# Patient Record
Sex: Female | Born: 1990
Health system: Southern US, Community
[De-identification: ages and names within clinical notes are randomized; demographics above are authoritative.]

## PROBLEM LIST (undated history)

## (undated) DIAGNOSIS — M439 Deforming dorsopathy, unspecified: Secondary | ICD-10-CM

## (undated) DIAGNOSIS — D649 Anemia, unspecified: Secondary | ICD-10-CM

## (undated) DIAGNOSIS — R61 Generalized hyperhidrosis: Secondary | ICD-10-CM

## (undated) DIAGNOSIS — O139 Gestational [pregnancy-induced] hypertension without significant proteinuria, unspecified trimester: Secondary | ICD-10-CM

## (undated) DIAGNOSIS — O24419 Gestational diabetes mellitus in pregnancy, unspecified control: Secondary | ICD-10-CM

## (undated) DIAGNOSIS — M8448XA Pathological fracture, other site, initial encounter for fracture: Secondary | ICD-10-CM

## (undated) HISTORY — DX: Anemia, unspecified: D64.9

## (undated) HISTORY — DX: Gestational (pregnancy-induced) hypertension without significant proteinuria, unspecified trimester: O13.9

## (undated) HISTORY — PX: CHOLECYSTECTOMY: SHX55

## (undated) HISTORY — PX: WISDOM TOOTH EXTRACTION: SHX21

## (undated) HISTORY — DX: Generalized hyperhidrosis: R61

## (undated) HISTORY — DX: Gestational diabetes mellitus in pregnancy, unspecified control: O24.419

---
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
## 2012-04-12 ENCOUNTER — Ambulatory Visit (INDEPENDENT_AMBULATORY_CARE_PROVIDER_SITE_OTHER): Payer: 59 | Admitting: Family Medicine

## 2012-04-12 ENCOUNTER — Encounter: Payer: Self-pay | Admitting: Family Medicine

## 2012-04-12 VITALS — BP 120/72 | HR 99 | Temp 98.5°F | Ht 62.0 in | Wt 204.0 lb

## 2012-04-12 DIAGNOSIS — H6980 Other specified disorders of Eustachian tube, unspecified ear: Secondary | ICD-10-CM

## 2012-04-12 NOTE — Progress Notes (Signed)
   Nature conservation officer at [facility] [address] [place] [address] Phone: [phone] Fax: [phone]  Date:  04/12/2012   Name:  Robin Salas   DOB:  25-Sep-1991   MRN:  [id number]  PCP:  Kerby Nora, MD    Chief Complaint: Otalgia   History of Present Illness:  Robin Salas is a 21 y.o. very pleasant female patient who presents with the following:  Pleasant young patient with some bilateral ear muffling. She has not tried anything at home. She is not really having any significant pain. She is having some decreased hearing somewhat.  Past Medical History, Surgical History, Social History, Family History, Problem List, Medications, and Allergies have been reviewed and updated if relevant.  No current outpatient prescriptions on file prior to visit.    Review of Systems:  Recent uri  Physical Examination: Filed Vitals:   04/12/12 1449  BP: 120/72  Pulse: 99  Temp: 98.5 F (36.9 C)   Filed Vitals:   04/12/12 1449  Height: 5\' 2"  (1.575 m)  Weight: 204 lb (92.534 kg)   Body mass index is 37.31 kg/(m^2). Ideal Body Weight: Weight in (lb) to have BMI = 25: 136.4    GEN: WDWN, NAD, Non-toxic, Alert & Oriented x 3 HEENT: Atraumatic, Normocephalic. Bilateral ears with serous fluid. Anatomy is visualized easily. Ears and Nose: No external deformity. EXTR: No clubbing/cyanosis/edema NEURO: Normal gait.  PSYCH: Normally interactive. Conversant. Not depressed or anxious appearing.  Calm demeanor.    Assessment and Plan: 1. Eustachian tube dysfunction     Advised antihistamine plus Sudafed. If she has not improving in a couple of weeks, something additional, like dosepak could be considered. She will call if pain develops. Not infected now  Hannah Beat, MD

## 2012-05-14 ENCOUNTER — Encounter: Payer: Self-pay | Admitting: Family Medicine

## 2012-05-14 ENCOUNTER — Ambulatory Visit (INDEPENDENT_AMBULATORY_CARE_PROVIDER_SITE_OTHER): Payer: 59 | Admitting: Family Medicine

## 2012-05-14 VITALS — BP 132/70 | Temp 98.5°F | Wt 202.0 lb

## 2012-05-14 DIAGNOSIS — L74519 Primary focal hyperhidrosis, unspecified: Secondary | ICD-10-CM

## 2012-05-14 MED ORDER — GLYCOPYRROLATE 2 MG PO TABS: 2.0000 mg | ORAL_TABLET | Freq: Two times a day (BID) | ORAL | Status: DC

## 2012-05-14 NOTE — Progress Notes (Signed)
  Subjective:    Patient ID: Robin Salas, female    DOB: Feb 06, 1991, 21 y.o.   MRN: [id number]  HPI  21 year old female presents for a refill off robinul. I have not seen her in years, 2009. She has seen several of my partners for acute issues off and on.    She is a homeschooled HS senior who has been on robinul for hyperhidrosis in the last year.  This has been a lifelong problem for the patient.  She has failed to receive much relief from Drysol, other topical scripts and OTC meds.  The robinul has improved the issue significantly. This has had an impact on her socially and had an impact on her choosing to go to homeschool.    She has some SE, dry mouth but tolerable.  Overdue for CPX.        Review of Systems  Constitutional: Negative for fever and fatigue.  HENT: Negative for ear pain.   Eyes: Negative for pain.  Respiratory: Negative for chest tightness and shortness of breath.   Cardiovascular: Negative for chest pain, palpitations and leg swelling.  Gastrointestinal: Negative for abdominal pain.  Genitourinary: Negative for dysuria.       Objective:   Physical Exam  Constitutional: Vital signs are normal. She appears well-developed and well-nourished. She is cooperative.  Non-toxic appearance. She does not appear ill. No distress.  HENT:  Head: Normocephalic.  Right Ear: Hearing, tympanic membrane, external ear and ear canal normal. Tympanic membrane is not erythematous, not retracted and not bulging.  Left Ear: Hearing, tympanic membrane, external ear and ear canal normal. Tympanic membrane is not erythematous, not retracted and not bulging.  Nose: No mucosal edema or rhinorrhea. Right sinus exhibits no maxillary sinus tenderness and no frontal sinus tenderness. Left sinus exhibits no maxillary sinus tenderness and no frontal sinus tenderness.  Mouth/Throat: Uvula is midline, oropharynx is clear and moist and mucous membranes are normal.  Eyes: Conjunctivae, EOM  and lids are normal. Pupils are equal, round, and reactive to light. No foreign bodies found.  Neck: Trachea normal and normal range of motion. Neck supple. Carotid bruit is not present. No mass and no thyromegaly present.  Cardiovascular: Normal rate, regular rhythm, S1 normal, S2 normal, normal heart sounds, intact distal pulses and normal pulses.  Exam reveals no gallop and no friction rub.   No murmur heard. Pulmonary/Chest: Effort normal and breath sounds normal. Not tachypneic. No respiratory distress. She has no decreased breath sounds. She has no wheezes. She has no rhonchi. She has no rales.  Abdominal: Soft. Normal appearance and bowel sounds are normal. There is no tenderness.  Neurological: She is alert.  Skin: Skin is warm, dry and intact. No rash noted.  Psychiatric: Her speech is normal and behavior is normal. Judgment and thought content normal. Her mood appears not anxious. Cognition and memory are normal. She does not exhibit a depressed mood.          Assessment & Plan:

## 2012-05-14 NOTE — Assessment & Plan Note (Signed)
Well controlled. Continue current medication.  

## 2012-08-05 ENCOUNTER — Ambulatory Visit: Payer: 59 | Admitting: Cardiovascular Disease

## 2012-08-14 ENCOUNTER — Encounter (HOSPITAL_COMMUNITY): Payer: Self-pay | Admitting: *Deleted

## 2012-08-14 ENCOUNTER — Emergency Department (HOSPITAL_COMMUNITY)
Admission: EM | Admit: 2012-08-14 | Discharge: 2012-08-15 | Disposition: A | Discharge status: Home / Self Care | Payer: 59 | Attending: Emergency Medicine | Admitting: Emergency Medicine

## 2012-08-14 DIAGNOSIS — M62830 Muscle spasm of back: Secondary | ICD-10-CM

## 2012-08-14 DIAGNOSIS — M5380 Other specified dorsopathies, site unspecified: Secondary | ICD-10-CM | POA: Insufficient documentation

## 2012-08-14 NOTE — ED Notes (Addendum)
Pt states [place] the [place] and hurt [place] the patient was never checked out for the back pain. Pt states that she was playing laser tag all day today and her lower back same area as fall started hurting and was worse tonight. Pt took 4 ibuprofen and heat to relief back pain with no relief. Pt states that she tried to massage the area and then her friend noticed that it was starting to knot up. Pt ambulatory but states pain hurts bad. Pt denies problems with urine or bowel.

## 2012-08-15 ENCOUNTER — Ambulatory Visit: Payer: 59

## 2012-08-15 ENCOUNTER — Ambulatory Visit (INDEPENDENT_AMBULATORY_CARE_PROVIDER_SITE_OTHER): Payer: 59 | Admitting: Internal Medicine

## 2012-08-15 VITALS — BP 132/84 | HR 83 | Temp 98.4°F | Resp 16 | Ht 63.0 in | Wt 203.0 lb

## 2012-08-15 DIAGNOSIS — S20229A Contusion of unspecified back wall of thorax, initial encounter: Secondary | ICD-10-CM

## 2012-08-15 DIAGNOSIS — M549 Dorsalgia, unspecified: Secondary | ICD-10-CM

## 2012-08-15 DIAGNOSIS — S300XXA Contusion of lower back and pelvis, initial encounter: Secondary | ICD-10-CM

## 2012-08-15 DIAGNOSIS — S335XXA Sprain of ligaments of lumbar spine, initial encounter: Secondary | ICD-10-CM

## 2012-08-15 MED ORDER — DIAZEPAM 5 MG PO TABS: 5.0000 mg | ORAL_TABLET | Freq: Four times a day (QID) | ORAL | Status: DC | PRN

## 2012-08-15 MED ORDER — KETOROLAC TROMETHAMINE 30 MG/ML IJ SOLN
30.0000 mg | Freq: Once | INTRAMUSCULAR | Status: AC
  Administered 2012-08-15: 30 mg via INTRAMUSCULAR
  Filled 2012-08-15: qty 1

## 2012-08-15 MED ORDER — HYDROCODONE-ACETAMINOPHEN 5-325 MG PO TABS: 1.0000 | ORAL_TABLET | Freq: Four times a day (QID) | ORAL | Status: DC | PRN

## 2012-08-15 MED ORDER — DIAZEPAM 5 MG PO TABS
5.0000 mg | ORAL_TABLET | Freq: Once | ORAL | Status: AC
  Administered 2012-08-15: 5 mg via ORAL
  Filled 2012-08-15: qty 1

## 2012-08-15 MED ORDER — CYCLOBENZAPRINE HCL 10 MG PO TABS: 10.0000 mg | ORAL_TABLET | Freq: Every day | ORAL | Status: DC

## 2012-08-15 MED ORDER — MELOXICAM 15 MG PO TABS: 15.0000 mg | ORAL_TABLET | Freq: Every day | ORAL | Status: DC

## 2012-08-15 NOTE — ED Provider Notes (Addendum)
History     CSN: [id number]  Arrival date & time 08/14/12  2333   First MD Initiated Contact with Patient 08/15/12 0004      Chief Complaint  Patient presents with  . Back Pain    (Consider location/radiation/quality/duration/timing/severity/associated sxs/prior treatment) HPI Comments: Back pain on and off for the past mont since falling at the beach  The history is provided by the patient.    Past Medical History  Diagnosis Date  . Hyperhydrosis disorder     History reviewed. No pertinent past surgical history.  History reviewed. No pertinent family history.  History  Substance Use Topics  . Smoking status: Never Smoker   . Smokeless tobacco: Not on file  . Alcohol Use: No    OB History    Grav Para Term Preterm Abortions TAB SAB Ect Mult Living                  Review of Systems  Respiratory: Negative for shortness of breath.   Gastrointestinal: Negative for vomiting and constipation.  Genitourinary: Negative for dysuria.  Musculoskeletal: Positive for back pain.  Skin: Negative for rash and wound.    Allergies  Amoxicillin and Penicillins  Home Medications   Current Outpatient Rx  Name  Route  Sig  Dispense  Refill  . GLYCOPYRROLATE 2 MG PO TABS   Oral   Take 1 tablet (2 mg total) by mouth 2 (two) times daily.   180 tablet   3   . DIAZEPAM 5 MG PO TABS   Oral   Take 1 tablet (5 mg total) by mouth every 6 (six) hours as needed for anxiety.   15 tablet   0   . HYDROCODONE-ACETAMINOPHEN 5-325 MG PO TABS   Oral   Take 1 tablet by mouth every 6 (six) hours as needed for pain.   7 tablet   0     BP 136/82  Pulse 90  Temp 97.9 F (36.6 C) (Oral)  Resp 18  SpO2 98%  Physical Exam  Constitutional: She is oriented to person, place, and time. She appears well-developed.  HENT:  Head: Normocephalic.  Eyes: Pupils are equal, round, and reactive to light.  Cardiovascular: Normal rate.   Pulmonary/Chest: Effort normal.  Musculoskeletal:  She exhibits tenderness. She exhibits no edema.       Arms: Neurological: She is alert and oriented to person, place, and time.  Skin: Skin is warm. No erythema.    ED Course  Procedures (including critical care time)  Labs Reviewed - No data to display No results found.   1. Muscle spasm of back       MDM  Muscle spasm         Arman Filter, NP 08/15/12 0102  Arman Filter, NP 08/25/12 [phone]

## 2012-08-15 NOTE — ED Notes (Signed)
Pt c/o lower back pain, reports she hurt her back today while playing laser tag, she slipped and fell. Pt reports she hurt the same spot in her back before.

## 2012-08-15 NOTE — ED Provider Notes (Signed)
Medical screening examination/treatment/procedure(s) were performed by non-physician practitioner and as supervising physician I was immediately available for consultation/collaboration.   Zamar Odwyer L Whitten Andreoni, MD 08/15/12 0707 

## 2012-08-15 NOTE — Progress Notes (Signed)
  Subjective:    Patient ID: Robin Salas, female    DOB: Jul 21, 1991, 21 y.o.   MRN: [id number]  HPIhx abrupt onset low back pain after slipping fall during laser tag yesterday Felt so bad she went to ER last pm but thought she had a very poor evaluationa nd the prescribed meds are having CNS and GI side effects Pain is bilat R>L with rad into R buttock/hurts to get up from lying or sitting/trouble rolling over/pain with fast walk///no numbness or weakness in legs  Past hx of fall onto sacrum 2 months ago that was painful several weeks-never evaluated    Review of Systems No fever No GU sxt No prior MVA     Objective:   Physical Exam In mild to moderate pain Filed Vitals:   08/15/12 1514  BP: 132/84  Pulse: 83  Temp: 98.4 F (36.9 C)  Resp: 16   Wt 203 Tender to palp over entire LS area incl bony landmarks R scia notch tender SLR pos bilat at 75deg Dtrs 2+ symm No perip sens or motor losses      [facility] reading (PRIMARY) by  Dr. Natahsa Marian=NAD/no compr fx or sp process fx//No spondy...   Assessment & Plan:  Lumbar Pain  Contusion lumbar  Sprain/strain lumbar Overweight  Meds ordered this encounter  Medications  . cyclobenzaprine (FLEXERIL) 10 MG tablet    Sig: Take 1 tablet (10 mg total) by mouth at bedtime.    Dispense:  30 tablet    Refill:  0  . meloxicam (MOBIC) 15 MG tablet    Sig: Take 1 tablet (15 mg total) by mouth daily.    Dispense:  30 tablet    Refill:  2  Use vic from ER only if can't sleep Heat Exercises graded Advised 12 weeks exercises followed by long term conditioning w/ wt loss

## 2012-08-15 NOTE — Progress Notes (Signed)
  Subjective:    Patient ID: Robin Salas, female    DOB: 1991-08-28, 21 y.o.   MRN: [id number]  HPI  Patient been having low back pain since yesturday.  Went to the er and was not pleased with what they did.  She was giving valium and an injection.  Prescribed hydrocodone and valium and that did not touch her pain.  Pain came on suddenly.  Played laser tag with some friends all day and then noticed back started hurting.  Room mate noticed a big goose egg on her back.  She took four ibuprofen and iced it.  Ibuprofen did help but then started hurting again.  Pain does not run into leg, when she touches it she feels pain in the buttocks.  No tingling or weakness when walking.  Having difficulty getting in and out of car.  No burning or frequency with urination.  About three months ago, she was skim boarding at the beach and landed on back.  But that pain went away.      Review of Systems     Objective:   Physical Exam        Assessment & Plan:

## 2012-10-20 ENCOUNTER — Encounter (HOSPITAL_COMMUNITY): Payer: Self-pay | Admitting: *Deleted

## 2012-10-20 ENCOUNTER — Emergency Department (HOSPITAL_COMMUNITY)
Admission: EM | Admit: 2012-10-20 | Discharge: 2012-10-20 | Disposition: A | Discharge status: Home / Self Care | Payer: 59 | Source: Home / Self Care | Attending: Emergency Medicine | Admitting: Emergency Medicine

## 2012-10-20 DIAGNOSIS — R6889 Other general symptoms and signs: Secondary | ICD-10-CM

## 2012-10-20 DIAGNOSIS — J111 Influenza due to unidentified influenza virus with other respiratory manifestations: Secondary | ICD-10-CM

## 2012-10-20 MED ORDER — GUAIFENESIN-CODEINE 100-10 MG/5ML PO SYRP
5.0000 mL | ORAL_SOLUTION | Freq: Three times a day (TID) | ORAL | Status: DC | PRN
Start: 2012-10-20 — End: 2012-11-05

## 2012-10-20 MED ORDER — CETIRIZINE-PSEUDOEPHEDRINE ER 5-120 MG PO TB12: 1.0000 | ORAL_TABLET | Freq: Every day | ORAL | Status: DC

## 2012-10-20 NOTE — ED Provider Notes (Signed)
History     CSN: [id number]  Arrival date & time 10/20/12  1233   First MD Initiated Contact with Patient 10/20/12 1243      Chief Complaint  Patient presents with  . URI    (Consider location/radiation/quality/duration/timing/severity/associated sxs/prior treatment) HPI Comments: Patient presents urgent care complaining of a nonproductive cough with tactile fevers with generalized body aches. No shortness of breath. Discomfort swallowing or sore throat and a headache denies any nausea vomiting or diarrheas. 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  Patient is a 22 y.o. female presenting with URI. The history is provided by the patient.  URI The primary symptoms include fever, headaches, sore throat, cough and arthralgias. Primary symptoms do not include fatigue, swollen glands, wheezing, abdominal pain, vomiting, myalgias or rash. The current episode started yesterday. This is a new problem. The problem has not changed since onset. The headache is not associated with eye pain or neck stiffness.  Symptoms associated with the illness include chills, sinus pressure, congestion and rhinorrhea.    Past Medical History  Diagnosis Date  . Hyperhydrosis disorder   . Anemia     History reviewed. No pertinent past surgical history.  Family History  Problem Relation Age of Onset  . Family history unknown: Yes    History  Substance Use Topics  . Smoking status: Never Smoker   . Smokeless tobacco: Not on file  . Alcohol Use: No    OB History    Grav Para Term Preterm Abortions TAB SAB Ect Mult Living                  Review of Systems  Constitutional: Positive for fever, chills and appetite change. Negative for fatigue.  HENT: Positive for congestion, sore throat, rhinorrhea and sinus pressure. Negative for neck pain and neck stiffness.   Eyes: Negative for pain.  Respiratory: Positive for cough. Negative for wheezing.     Cardiovascular: Negative for chest pain and palpitations.  Gastrointestinal: Negative for vomiting, abdominal pain and diarrhea.  Musculoskeletal: Positive for arthralgias. Negative for myalgias.  Skin: Negative for rash.  Neurological: Positive for headaches. Negative for dizziness.    Allergies  Amoxicillin and Penicillins  Home Medications   Current Outpatient Rx  Name  Route  Sig  Dispense  Refill  . CETIRIZINE-PSEUDOEPHEDRINE ER 5-120 MG PO TB12   Oral   Take 1 tablet by mouth daily.   30 tablet   0   . CYCLOBENZAPRINE HCL 10 MG PO TABS   Oral   Take 1 tablet (10 mg total) by mouth at bedtime.   30 tablet   0   . DIAZEPAM 5 MG PO TABS   Oral   Take 1 tablet (5 mg total) by mouth every 6 (six) hours as needed for anxiety.   15 tablet   0   . GLYCOPYRROLATE 2 MG PO TABS   Oral   Take 1 tablet (2 mg total) by mouth 2 (two) times daily.   180 tablet   3   . GUAIFENESIN-CODEINE 100-10 MG/5ML PO SYRP   Oral   Take 5 mLs by mouth 3 (three) times daily as needed for cough or congestion.   120 mL   0   . HYDROCODONE-ACETAMINOPHEN 5-325 MG PO TABS   Oral   Take 1 tablet by mouth every 6 (six) hours as needed for pain.   7 tablet   0   . IBUPROFEN 200 MG PO TABS  Oral   Take 200 mg by mouth every 6 (six) hours as needed.         . MELOXICAM 15 MG PO TABS   Oral   Take 1 tablet (15 mg total) by mouth daily.   30 tablet   2     BP 122/73  Pulse 112  Temp 99.6 F (37.6 C) (Oral)  Resp 16  Ht 5\' 2"  (1.575 m)  Wt 180 lb (81.647 kg)  BMI 32.92 kg/m2  SpO2 100%  LMP 07/25/2012  Physical Exam  Nursing note and vitals reviewed. Constitutional: Vital signs are normal. She appears well-developed and well-nourished.  Non-toxic appearance. She does not have a sickly appearance. She does not appear ill. No distress.  HENT:  Head: Normocephalic.  Right Ear: Tympanic membrane normal.  Left Ear: Tympanic membrane normal.  Nose: Rhinorrhea present.   Mouth/Throat: Uvula is midline. Posterior oropharyngeal erythema present. No oropharyngeal exudate, posterior oropharyngeal edema or tonsillar abscesses.  Eyes: Conjunctivae normal are normal. No scleral icterus.  Neck: Neck supple. No JVD present.  Cardiovascular: Normal rate.  Exam reveals no gallop and no friction rub.   No murmur heard. Pulmonary/Chest: Effort normal and breath sounds normal. No respiratory distress. She has no decreased breath sounds. She has no wheezes. She has no rhonchi. She has no rales.  Lymphadenopathy:    She has no cervical adenopathy.  Neurological: She is alert.  Skin: No erythema.    ED Course  Procedures (including critical care time)  Labs Reviewed - No data to display No results found.   1. Influenza-like symptoms       MDM  Influenza-like illness. Patient is no respiratory distress, have been prescribed medicines for symptom management such as a cough syrup and decongestant antihistamine. Have recommended patient to return for recheck as necessary if any worsening symptoms. Patient agrees with treatment plan and follow-up care as necessary        Jimmie Molly, MD 10/20/12 1332

## 2012-10-20 NOTE — ED Notes (Signed)
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

## 2012-11-05 ENCOUNTER — Ambulatory Visit (INDEPENDENT_AMBULATORY_CARE_PROVIDER_SITE_OTHER): Payer: 59 | Admitting: Family Medicine

## 2012-11-05 ENCOUNTER — Encounter: Payer: Self-pay | Admitting: Family Medicine

## 2012-11-05 VITALS — BP 140/78 | HR 96 | Temp 98.6°F | Ht 62.0 in | Wt 213.0 lb

## 2012-11-05 DIAGNOSIS — E282 Polycystic ovarian syndrome: Secondary | ICD-10-CM

## 2012-11-05 DIAGNOSIS — N912 Amenorrhea, unspecified: Secondary | ICD-10-CM

## 2012-11-05 LAB — POCT URINE PREGNANCY: Preg Test, Ur: NEGATIVE

## 2012-11-05 NOTE — Assessment & Plan Note (Signed)
Upreg negative.  Will eval with labs. Concern for PCOS given weight. ? If obesity is cause of ammenorrhea.  Consider OCPs depending on what lab resutls show.

## 2012-11-05 NOTE — Patient Instructions (Addendum)
We will call with lab results   

## 2012-11-05 NOTE — Progress Notes (Signed)
  Subjective:    Patient ID: Robin Salas, female    DOB: 1990/12/28, 22 y.o.   MRN: [id number]  HPI 22 year old female presents with ammenorhea. She reports last menses was 07/25/2012. Prior to that it hd been very regular. Lasting 7 days, every 28 days. Never been on OCPs.  No new hair growth. No deepening of voice.  Mild weight gain in the last year. Wt Readings from Last 3 Encounters:  11/05/12 213 lb (96.616 kg)  10/20/12 180 lb (81.647 kg)  08/15/12 203 lb (92.08 kg) (97.65%*)   * Growth percentiles are based on CDC 2-20 Years data.   Has been under a lot of stress lately. No new meds. She has never been sexually active.  No family history of PCOS.   Review of Systems  Constitutional: Negative for fever and fatigue.  HENT: Negative for ear pain.   Eyes: Negative for pain.  Respiratory: Negative for chest tightness and shortness of breath.   Cardiovascular: Negative for chest pain, palpitations and leg swelling.  Gastrointestinal: Negative for abdominal pain.  Genitourinary: Negative for dysuria.       Objective:   Physical Exam  Constitutional: Vital signs are normal. She appears well-developed and well-nourished. She is cooperative.  Non-toxic appearance. She does not appear ill. No distress.       Obese.  HENT:  Head: Normocephalic.  Right Ear: Hearing, tympanic membrane, external ear and ear canal normal. Tympanic membrane is not erythematous, not retracted and not bulging.  Left Ear: Hearing, tympanic membrane, external ear and ear canal normal. Tympanic membrane is not erythematous, not retracted and not bulging.  Nose: No mucosal edema or rhinorrhea. Right sinus exhibits no maxillary sinus tenderness and no frontal sinus tenderness. Left sinus exhibits no maxillary sinus tenderness and no frontal sinus tenderness.  Mouth/Throat: Uvula is midline, oropharynx is clear and moist and mucous membranes are normal.  Eyes: Conjunctivae normal, EOM and lids are  normal. Pupils are equal, round, and reactive to light. No foreign bodies found.  Neck: Trachea normal and normal range of motion. Neck supple. Carotid bruit is not present. No mass and no thyromegaly present.  Cardiovascular: Normal rate, regular rhythm, S1 normal, S2 normal, normal heart sounds, intact distal pulses and normal pulses.  Exam reveals no gallop and no friction rub.   No murmur heard. Pulmonary/Chest: Effort normal and breath sounds normal. Not tachypneic. No respiratory distress. She has no decreased breath sounds. She has no wheezes. She has no rhonchi. She has no rales.  Abdominal: Soft. Normal appearance and bowel sounds are normal. There is no tenderness.  Neurological: She is alert.  Skin: Skin is warm, dry and intact. No rash noted.  Psychiatric: Her speech is normal and behavior is normal. Judgment and thought content normal. Her mood appears not anxious. Cognition and memory are normal. She does not exhibit a depressed mood.          Assessment & Plan:

## 2012-11-05 NOTE — Addendum Note (Signed)
Addended by: Alvina Chou on: 11/05/2012 04:41 PM   Modules accepted: Orders

## 2012-11-06 LAB — FOLLICLE STIMULATING HORMONE: FSH: 5.3 m[IU]/mL

## 2012-11-08 LAB — TESTOSTERONE, FREE, TOTAL, SHBG
Sex Hormone Binding: 16 nmol/L — ABNORMAL LOW (ref 18–114)
Testosterone: 52 ng/dL (ref 10–70)

## 2012-11-09 NOTE — Addendum Note (Signed)
Addended byKerby Nora E on: 11/09/2012 01:01 PM   Modules accepted: Orders

## 2012-11-19 ENCOUNTER — Other Ambulatory Visit (INDEPENDENT_AMBULATORY_CARE_PROVIDER_SITE_OTHER): Payer: 59

## 2012-11-19 DIAGNOSIS — N912 Amenorrhea, unspecified: Secondary | ICD-10-CM

## 2012-11-19 DIAGNOSIS — E282 Polycystic ovarian syndrome: Secondary | ICD-10-CM

## 2012-11-19 LAB — LIPID PANEL
HDL: 40.4 mg/dL (ref >39.00)
Total CHOL/HDL Ratio: 4
Triglycerides: 116 mg/dL (ref 0.0–149.0)
VLDL: 23.2 mg/dL (ref 0.0–40.0)

## 2012-11-19 LAB — COMPREHENSIVE METABOLIC PANEL
ALT: 16 U/L (ref 0–35)
AST: 20 U/L (ref 0–37)
Alkaline Phosphatase: 68 U/L (ref 39–117)
BUN: 10 mg/dL (ref 6–23)
Calcium: 9.3 mg/dL (ref 8.4–10.5)
Chloride: 103 mEq/L (ref 96–112)
Creatinine, Ser: 0.8 mg/dL (ref 0.4–1.2)
Total Bilirubin: 0.6 mg/dL (ref 0.3–1.2)

## 2012-11-19 LAB — HEMOGLOBIN A1C: Hgb A1c MFr Bld: 5.8 % (ref 4.6–6.5)

## 2012-11-20 LAB — PROLACTIN: Prolactin: 17.6 ng/mL

## 2012-11-22 ENCOUNTER — Encounter: Payer: Self-pay | Admitting: Family Medicine

## 2012-11-22 ENCOUNTER — Ambulatory Visit (INDEPENDENT_AMBULATORY_CARE_PROVIDER_SITE_OTHER): Payer: 59 | Admitting: Family Medicine

## 2012-11-22 VITALS — BP 120/72 | HR 88 | Temp 98.9°F | Ht 62.0 in | Wt 215.5 lb

## 2012-11-22 DIAGNOSIS — E282 Polycystic ovarian syndrome: Secondary | ICD-10-CM

## 2012-11-22 DIAGNOSIS — N912 Amenorrhea, unspecified: Secondary | ICD-10-CM

## 2012-11-22 HISTORY — DX: Polycystic ovarian syndrome: E28.2

## 2012-11-22 MED ORDER — DESOGESTREL-ETHINYL ESTRADIOL 0.15-30 MG-MCG PO TABS: 1.0000 | ORAL_TABLET | Freq: Every day | ORAL | Status: DC

## 2012-11-22 NOTE — Patient Instructions (Addendum)
Start healthy eating and regular exercise program you have planned. Start oral birth control medication.  Call if any symptoms.

## 2012-11-22 NOTE — Progress Notes (Signed)
  Subjective:    Patient ID: Robin Salas, female    DOB: [birth year], 22 y.o.   MRN: [id number]  HPI  22 year old female returns to clinic to discuss possible PCO. LAst menses Oct 26 until recently. Had a menses start 2/15 that lasted 7 days. She reports it was slightly heavier than usual. She had increase in abdominal cramping.  Review of Systems  Constitutional: Negative for fever and fatigue.  HENT: Negative for ear pain.   Eyes: Negative for pain.  Respiratory: Negative for shortness of breath.   Cardiovascular: Negative for chest pain.       Objective:   Physical Exam  Constitutional: Vital signs are normal. She appears well-developed and well-nourished. She is cooperative.  Non-toxic appearance. She does not appear ill. No distress.  HENT:  Head: Normocephalic.  Right Ear: Hearing, tympanic membrane, external ear and ear canal normal. Tympanic membrane is not erythematous, not retracted and not bulging.  Left Ear: Hearing, tympanic membrane, external ear and ear canal normal. Tympanic membrane is not erythematous, not retracted and not bulging.  Nose: No mucosal edema or rhinorrhea. Right sinus exhibits no maxillary sinus tenderness and no frontal sinus tenderness. Left sinus exhibits no maxillary sinus tenderness and no frontal sinus tenderness.  Mouth/Throat: Uvula is midline, oropharynx is clear and moist and mucous membranes are normal.  Eyes: Conjunctivae, EOM and lids are normal. Pupils are equal, round, and reactive to light. No foreign bodies found.  Neck: Trachea normal and normal range of motion. Neck supple. Carotid bruit is not present. No mass and no thyromegaly present.  Cardiovascular: Normal rate, regular rhythm, S1 normal, S2 normal, normal heart sounds, intact distal pulses and normal pulses.  Exam reveals no gallop and no friction rub.   No murmur heard. Pulmonary/Chest: Effort normal and breath sounds normal. Not tachypneic. No respiratory distress. She has no  decreased breath sounds. She has no wheezes. She has no rhonchi. She has no rales.  Abdominal: Soft. Normal appearance and bowel sounds are normal. There is no tenderness.  Neurological: She is alert.  Skin: Skin is warm, dry and intact. No rash noted.  Psychiatric: Her speech is normal and behavior is normal. Judgment and thought content normal. Her mood appears not anxious. Cognition and memory are normal. She does not exhibit a depressed mood.          Assessment & Plan:

## 2012-11-22 NOTE — Assessment & Plan Note (Signed)
LH/FSH ratio elevated, likely PCOS. We are awaiting DHEA results, prolactin nml. Discussed likely diagnosis in detail with pt and grandmother. She will start aggressive diet, weight loss and exercise regimen.Start OCPS to regulate menses and avoid endometrial hyperplasia.  Labs show nml glucose, LDl at goal.

## 2012-11-24 LAB — DHEA: DHEA: 337 ng/dL (ref 102–1185)

## 2013-01-11 ENCOUNTER — Emergency Department (HOSPITAL_COMMUNITY)
Admission: EM | Admit: 2013-01-11 | Discharge: 2013-01-11 | Disposition: A | Discharge status: Home / Self Care | Payer: 59 | Attending: Emergency Medicine | Admitting: Emergency Medicine

## 2013-01-11 ENCOUNTER — Encounter (HOSPITAL_COMMUNITY): Payer: Self-pay | Admitting: *Deleted

## 2013-01-11 DIAGNOSIS — M549 Dorsalgia, unspecified: Secondary | ICD-10-CM

## 2013-01-11 DIAGNOSIS — M545 Low back pain, unspecified: Secondary | ICD-10-CM | POA: Insufficient documentation

## 2013-01-11 DIAGNOSIS — Z872 Personal history of diseases of the skin and subcutaneous tissue: Secondary | ICD-10-CM | POA: Insufficient documentation

## 2013-01-11 DIAGNOSIS — Z862 Personal history of diseases of the blood and blood-forming organs and certain disorders involving the immune mechanism: Secondary | ICD-10-CM | POA: Insufficient documentation

## 2013-01-11 MED ORDER — HYDROCODONE-ACETAMINOPHEN 5-325 MG PO TABS: 2.0000 | ORAL_TABLET | ORAL | Status: DC | PRN

## 2013-01-11 MED ORDER — HYDROCODONE-ACETAMINOPHEN 5-325 MG PO TABS: 1.0000 | ORAL_TABLET | ORAL | Status: DC | PRN

## 2013-01-11 NOTE — ED Notes (Signed)
The pt has had lower back pain for one year intermittently.  She had soreness yesterday but today the pain has increased

## 2013-01-11 NOTE — ED Provider Notes (Signed)
History    This chart was scribed for non-physician practitioner Francee Piccolo, PA-C working with Laray Anger, DO by Gerlean Ren, ED Scribe. This patient was seen in room TR09C/TR09C and the patient's care was started at 11:02 PM.    CSN: [id number]  Arrival date & time 01/11/13  2146   First MD Initiated Contact with Patient 01/11/13 2200      Chief Complaint  Patient presents with  . Back Pain     The history is provided by the patient. No language interpreter was used.  Robin Salas is a 22 y.o. female who presents to the Emergency Department complaining of one year of intermittent lower back pain that was increased today with no recent falls, unusual activities, or traumas.  Pt reports pain has not been present for the past 2 months.  Pt reports she had negative XR 07/2012 and was diagnosed with back spasms and given Flexeril.  Pt reports she took 2 Flexeril 4-5 hours PTA when pain was 8/10 with no improvements to pain.  Pain today has been sharp and constant that is worsened with movements.  Pain currently 5/10.  Pain radiates into bilateral buttocks.  Pt denies numbness, incontinence of bowel or bladder.  No known allergies.    Past Medical History  Diagnosis Date  . Hyperhydrosis disorder   . Anemia     History reviewed. No pertinent past surgical history.  No family history on file.  History  Substance Use Topics  . Smoking status: Never Smoker   . Smokeless tobacco: Not on file  . Alcohol Use: No    No OB history provided.   Review of Systems  Musculoskeletal: Positive for back pain.  Neurological: Negative for numbness.       Negative incontinence of bowel or bladder    Allergies  Amoxicillin and Penicillins  Home Medications   Current Outpatient Rx  Name  Route  Sig  Dispense  Refill  . cyclobenzaprine (FLEXERIL) 10 MG tablet   Oral   Take 20 mg by mouth once.         . desogestrel-ethinyl estradiol (APRI) 0.15-30 MG-MCG tablet    Oral   Take 1 tablet by mouth daily.   1 Package   11   . glycopyrrolate (ROBINUL) 2 MG tablet   Oral   Take 1 tablet (2 mg total) by mouth 2 (two) times daily.   180 tablet   3   . Multiple Vitamins-Minerals (HAIR/SKIN/NAILS/BIOTIN) TABS   Oral   Take 1 tablet by mouth daily.           BP 129/76  Pulse 99  Temp(Src) 98.4 F (36.9 C) (Oral)  Resp 14  SpO2 100%  LMP 01/11/2013  Physical Exam  Nursing note and vitals reviewed. Constitutional: She is oriented to person, place, and time. She appears well-developed and well-nourished. No distress.  HENT:  Head: Normocephalic and atraumatic.  Eyes: EOM are normal.  Neck: Neck supple. No tracheal deviation present.  Cardiovascular: Normal rate, regular rhythm and normal heart sounds.   Pulmonary/Chest: Effort normal and breath sounds normal. No respiratory distress. She has no wheezes.  Musculoskeletal: Normal range of motion.  No tenderness, no bony tenderness, decreased ROM due to pain, able to ambulate  Neurological: She is alert and oriented to person, place, and time.  5/5 strength in upper and lower extremities bilaterally  Skin: Skin is warm and dry.  Psychiatric: She has a normal mood and affect. Her behavior is  normal.    ED Course  Procedures (including critical care time) DIAGNOSTIC STUDIES: Oxygen Saturation is 100% on room air, normal by my interpretation.    COORDINATION OF CARE: 11:15 PM- Informed pt that further imaging is not immediately necessary at this time and that follow-up with PCP is needed.  Discussed short-term pain medication.  Pt verbalizes understanding and agrees with plan.    1. Back pain       MDM  Patient with back pain.  No neurological deficits and normal neuro exam.  Patient can walk but states is painful.  No loss of bowel or bladder control.  No concern for cauda equina.  No fever, night sweats, weight loss, h/o cancer, IVDU.  RICE protocol and pain medicine indicated and  discussed with patient.       I personally performed the services described in this documentation, which was scribed in my presence. The recorded information has been reviewed and is accurate.     Jeannetta Ellis, PA-C 01/12/13 [id number]

## 2013-01-13 NOTE — ED Provider Notes (Signed)
Medical screening examination/treatment/procedure(s) were performed by non-physician practitioner and as supervising physician I was immediately available for consultation/collaboration.   Laray Anger, DO 01/13/13 1444

## 2013-02-03 ENCOUNTER — Other Ambulatory Visit: Payer: Self-pay | Admitting: *Deleted

## 2013-02-03 MED ORDER — DESOGESTREL-ETHINYL ESTRADIOL 0.15-30 MG-MCG PO TABS: 1.0000 | ORAL_TABLET | Freq: Every day | ORAL | Status: DC

## 2013-05-20 ENCOUNTER — Encounter (HOSPITAL_COMMUNITY): Payer: Self-pay | Admitting: Emergency Medicine

## 2013-05-20 ENCOUNTER — Emergency Department (HOSPITAL_COMMUNITY)
Admission: EM | Admit: 2013-05-20 | Discharge: 2013-05-20 | Disposition: A | Discharge status: Home / Self Care | Payer: 59 | Source: Home / Self Care | Attending: Emergency Medicine | Admitting: Emergency Medicine

## 2013-05-20 DIAGNOSIS — H6590 Unspecified nonsuppurative otitis media, unspecified ear: Secondary | ICD-10-CM

## 2013-05-20 DIAGNOSIS — J069 Acute upper respiratory infection, unspecified: Secondary | ICD-10-CM

## 2013-05-20 MED ORDER — FLUTICASONE PROPIONATE 50 MCG/ACT NA SUSP: NASAL | Status: DC

## 2013-05-20 MED ORDER — HYDROCOD POLST-CHLORPHEN POLST 10-8 MG/5ML PO LQCR: 5.0000 mL | Freq: Two times a day (BID) | ORAL | Status: DC | PRN

## 2013-05-20 MED ORDER — PSEUDOEPHEDRINE-IBUPROFEN 30-200 MG PO CAPS: ORAL_CAPSULE | ORAL | Status: DC

## 2013-05-20 MED ORDER — METHYLPREDNISOLONE 4 MG PO KIT: PACK | ORAL | Status: DC

## 2013-05-20 NOTE — ED Notes (Signed)
Pt c/o cold sxs onset 2 weeks Started out w/fluid in both ears Now c/o dry cough, nasal congestion Denies: f/v/n/d, SOB, wheezing.... Taking benadryl w/no relief.

## 2013-05-20 NOTE — ED Provider Notes (Signed)
Medical screening examination/treatment/procedure(s) were performed by non-physician practitioner and as supervising physician I was immediately available for consultation/collaboration.  Leslee Home, M.D.  Reuben Likes, MD 05/20/13 2213

## 2013-05-20 NOTE — ED Provider Notes (Signed)
CSN: [id number]     Arrival date & time 05/20/13  1702 History     First MD Initiated Contact with Patient 05/20/13 1817     Chief Complaint  Patient presents with  . URI   (Consider location/radiation/quality/duration/timing/severity/associated sxs/prior Treatment) HPI Comments: 22 year old female presents complaining of pressure in her left ear for 2 weeks, 4 days ago developing some pressure in the right ear, and now with some mild sinus congestion. She gets moderate infrequent sharp stabs of pain but mostly what she feels is some mild pressure in the ears. She has also had a dry cough for the last few days and some nasal congestion. She has tried taking over-the-counter Benadryl without relief of her symptoms. She denies any fever, chills, chest pain, shortness of breath. Cough is dry and nonproductive. She has no sick contacts or recent travel.  Patient is a 22 y.o. female presenting with URI.  URI Presenting symptoms: congestion, cough, ear pain and fatigue   Presenting symptoms: no fever, no rhinorrhea and no sore throat   Associated symptoms: no arthralgias, no myalgias and no neck pain     Past Medical History  Diagnosis Date  . Hyperhydrosis disorder   . Anemia    History reviewed. No pertinent past surgical history. No family history on file. History  Substance Use Topics  . Smoking status: Never Smoker   . Smokeless tobacco: Not on file  . Alcohol Use: No   OB History   Grav Para Term Preterm Abortions TAB SAB Ect Mult Living                 Review of Systems  Constitutional: Positive for fatigue. Negative for fever and chills.  HENT: Positive for ear pain, congestion and sinus pressure. Negative for sore throat, rhinorrhea, neck pain and postnasal drip.   Eyes: Negative for visual disturbance.  Respiratory: Positive for cough. Negative for shortness of breath.   Cardiovascular: Negative for chest pain, palpitations and leg swelling.  Gastrointestinal: Negative  for nausea, vomiting and abdominal pain.  Endocrine: Negative for polydipsia and polyuria.  Genitourinary: Negative for dysuria, urgency and frequency.  Musculoskeletal: Negative for myalgias and arthralgias.  Skin: Negative for rash.  Neurological: Negative for dizziness, weakness and light-headedness.    Allergies  Amoxicillin and Penicillins  Home Medications   Current Outpatient Rx  Name  Route  Sig  Dispense  Refill  . chlorpheniramine-HYDROcodone (TUSSIONEX PENNKINETIC ER) 10-8 MG/5ML LQCR   Oral   Take 5 mLs by mouth every 12 (twelve) hours as needed.   140 mL   0   . cyclobenzaprine (FLEXERIL) 10 MG tablet   Oral   Take 20 mg by mouth once.         . desogestrel-ethinyl estradiol (APRI) 0.15-30 MG-MCG tablet   Oral   Take 1 tablet by mouth daily.   3 Package   2   . fluticasone (FLONASE) 50 MCG/ACT nasal spray      2 sprays/nostril BID   16 g   2   . glycopyrrolate (ROBINUL) 2 MG tablet   Oral   Take 1 tablet (2 mg total) by mouth 2 (two) times daily.   180 tablet   3   . HYDROcodone-acetaminophen (NORCO/VICODIN) 5-325 MG per tablet   Oral   Take 2 tablets by mouth every 4 (four) hours as needed for pain.   12 tablet   0   . methylPREDNISolone (MEDROL DOSEPAK) 4 MG tablet  Use as directed   21 tablet   0     Dispense as written.   . Multiple Vitamins-Minerals (HAIR/SKIN/NAILS/BIOTIN) TABS   Oral   Take 1 tablet by mouth daily.         . Pseudoephedrine-Ibuprofen (ADVIL COLD & SINUS LIQUI-GELS) 30-200 MG CAPS      2 tabs PO QID PRN   84 each   0    BP 133/90  Pulse 105  Temp(Src) 97.5 F (36.4 C) (Oral)  Resp 15  SpO2 100%  LMP 05/04/2013 Physical Exam  Nursing note and vitals reviewed. Constitutional: She is oriented to person, place, and time. She appears well-developed and well-nourished. No distress.  HENT:  Head: Normocephalic and atraumatic.  Right Ear: Hearing, external ear and ear canal normal. A middle ear  effusion (serous) is present.  Left Ear: Hearing, external ear and ear canal normal. A middle ear effusion (serous) is present.  Nose: Right sinus exhibits no maxillary sinus tenderness and no frontal sinus tenderness. Left sinus exhibits no maxillary sinus tenderness and no frontal sinus tenderness.  Pulmonary/Chest: Effort normal.  Lymphadenopathy:    She has no cervical adenopathy.  Neurological: She is alert and oriented to person, place, and time.  Skin: Skin is warm and dry. No rash noted. She is not diaphoretic.  Psychiatric: She has a normal mood and affect. Judgment normal.    ED Course   Procedures (including critical care time)  Labs Reviewed - No data to display No results found. 1. URI (upper respiratory infection)   2. Serous otitis media, bilateral     MDM  URI with serous OM. Treat symptomatically. Followup as needed.   Meds ordered this encounter  Medications  . fluticasone (FLONASE) 50 MCG/ACT nasal spray    Sig: 2 sprays/nostril BID    Dispense:  16 g    Refill:  2  . methylPREDNISolone (MEDROL DOSEPAK) 4 MG tablet    Sig: Use as directed    Dispense:  21 tablet    Refill:  0  . chlorpheniramine-HYDROcodone (TUSSIONEX PENNKINETIC ER) 10-8 MG/5ML LQCR    Sig: Take 5 mLs by mouth every 12 (twelve) hours as needed.    Dispense:  140 mL    Refill:  0  . Pseudoephedrine-Ibuprofen (ADVIL COLD & SINUS LIQUI-GELS) 30-200 MG CAPS    Sig: 2 tabs PO QID PRN    Dispense:  84 each    Refill:  0     Graylon Good, PA-C 05/20/13 1839

## 2013-05-26 ENCOUNTER — Encounter: Payer: Self-pay | Admitting: Family Medicine

## 2013-05-26 ENCOUNTER — Ambulatory Visit (INDEPENDENT_AMBULATORY_CARE_PROVIDER_SITE_OTHER): Payer: 59 | Admitting: Family Medicine

## 2013-05-26 VITALS — BP 138/80 | HR 96 | Temp 98.2°F | Wt 211.2 lb

## 2013-05-26 DIAGNOSIS — H6990 Unspecified Eustachian tube disorder, unspecified ear: Secondary | ICD-10-CM | POA: Insufficient documentation

## 2013-05-26 DIAGNOSIS — H6980 Other specified disorders of Eustachian tube, unspecified ear: Secondary | ICD-10-CM

## 2013-05-26 DIAGNOSIS — E282 Polycystic ovarian syndrome: Secondary | ICD-10-CM

## 2013-05-26 DIAGNOSIS — H6993 Unspecified Eustachian tube disorder, bilateral: Secondary | ICD-10-CM

## 2013-05-26 DIAGNOSIS — N912 Amenorrhea, unspecified: Secondary | ICD-10-CM

## 2013-05-26 DIAGNOSIS — H6983 Other specified disorders of Eustachian tube, bilateral: Secondary | ICD-10-CM

## 2013-05-26 DIAGNOSIS — J209 Acute bronchitis, unspecified: Secondary | ICD-10-CM

## 2013-05-26 MED ORDER — DOXYCYCLINE HYCLATE 100 MG PO TABS: 100.0000 mg | ORAL_TABLET | Freq: Two times a day (BID) | ORAL | Status: DC

## 2013-05-26 MED ORDER — DESOGESTREL-ETHINYL ESTRADIOL 0.15-30 MG-MCG PO TABS: 1.0000 | ORAL_TABLET | Freq: Every day | ORAL | Status: DC

## 2013-05-26 NOTE — Patient Instructions (Addendum)
Start and complete antibiotics. Continue nasal flonase , decongestant, can add mucinex. Nasal saline irrigation or spray 2-3 times a day.  Restart birth control for PCOS.

## 2013-05-26 NOTE — Progress Notes (Signed)
  Subjective:    Patient ID: Robin Salas, female    DOB: 09-11-1991, 22 y.o.   MRN: [id number]  Cough This is a new problem. The current episode started 1 to 4 weeks ago (3 weeks ago with fullness in left ear then in last week spread to right ear, seen at urgent care   DX with viral infection and ETD given nasal sterois, decongestant, tusionex, methylprednisolone). The problem has been gradually worsening. The problem occurs constantly. The cough is productive of purulent sputum (now productive in last few days). Associated symptoms include ear pain and nasal congestion. Pertinent negatives include no chest pain, chills, fever, shortness of breath or wheezing. Nothing aggravates the symptoms. She has tried oral steroids and prescription cough suppressant (see above) for the symptoms. The treatment provided no relief. Her past medical history is significant for environmental allergies. There is no history of asthma, COPD or emphysema.   Nonsmoker.  PCOS/amennorheawas doing well on new OCPs. More menstrual regularity. Mood is controlled as well. She wishes to restart it.    Review of Systems  Constitutional: Negative for fever and chills.  HENT: Positive for ear pain.   Respiratory: Positive for cough. Negative for shortness of breath and wheezing.   Cardiovascular: Negative for chest pain.  Allergic/Immunologic: Positive for environmental allergies.       Objective:   Physical Exam  Constitutional: Vital signs are normal. She appears well-developed and well-nourished. She is cooperative.  Non-toxic appearance. She does not appear ill. No distress.  HENT:  Head: Normocephalic.  Right Ear: Hearing, external ear and ear canal normal. Tympanic membrane is erythematous. Tympanic membrane is not retracted and not bulging. A middle ear effusion is present.  Left Ear: Hearing, external ear and ear canal normal. Tympanic membrane is not erythematous, not retracted and not bulging. A middle ear  effusion is present.  Nose: Mucosal edema and rhinorrhea present. Right sinus exhibits no maxillary sinus tenderness and no frontal sinus tenderness. Left sinus exhibits no maxillary sinus tenderness and no frontal sinus tenderness.  Mouth/Throat: Uvula is midline, oropharynx is clear and moist and mucous membranes are normal. No oropharyngeal exudate or posterior oropharyngeal erythema.  Eyes: Conjunctivae, EOM and lids are normal. Pupils are equal, round, and reactive to light. Lids are everted and swept, no foreign bodies found.  Neck: Trachea normal and normal range of motion. Neck supple. Carotid bruit is not present. No mass and no thyromegaly present.  Cardiovascular: Normal rate, regular rhythm, S1 normal, S2 normal, normal heart sounds, intact distal pulses and normal pulses.  Exam reveals no gallop and no friction rub.   No murmur heard. Pulmonary/Chest: Effort normal and breath sounds normal. Not tachypneic. No respiratory distress. She has no decreased breath sounds. She has no wheezes. She has no rhonchi. She has no rales.  Abdominal: Soft. Normal appearance and bowel sounds are normal. There is no tenderness.  Neurological: She is alert.  Skin: Skin is warm, dry and intact. No rash noted.  Psychiatric: Her speech is normal and behavior is normal. Judgment and thought content normal. Her mood appears not anxious. Cognition and memory are normal. She does not exhibit a depressed mood.          Assessment & Plan:

## 2013-05-26 NOTE — Assessment & Plan Note (Signed)
Cover with antibiotics given new productive cough, worsening and > 2 weeks duration.

## 2013-05-26 NOTE — Assessment & Plan Note (Signed)
Nasal steroid, decongenstant, nasal saline.

## 2013-05-26 NOTE — Assessment & Plan Note (Signed)
Improved on OCPs. Refill.

## 2013-05-26 NOTE — Assessment & Plan Note (Signed)
Resolved with treatment of PCOS.

## 2013-06-01 ENCOUNTER — Telehealth: Payer: Self-pay | Admitting: Family Medicine

## 2013-06-01 NOTE — Telephone Encounter (Signed)
Shakya /Patient Phone [phone] called to ask about Doxycycline.  Taking it BID since 05/26/13 for otitis media.  Stopped taking it after morning dose 05/31/13 due to nausea. Vomited several times 05/31/13 morning.  Is supposed to be on it for another 5 days.  Called from work. Declined triage.  No current nausea or vomiting. Dull ear pain noted this morning.  Request MD advice regarding if another antibiotic can be called in or if she is on "too much medicine."  Please call back.

## 2013-06-02 NOTE — Telephone Encounter (Signed)
Patient notified as instructed by telephone. 

## 2013-06-02 NOTE — Telephone Encounter (Signed)
I think it would be okay to stop antibiotic and see how she does. If ear pain not improving... Call.

## 2013-06-05 ENCOUNTER — Ambulatory Visit: Payer: 59

## 2013-06-05 ENCOUNTER — Ambulatory Visit (INDEPENDENT_AMBULATORY_CARE_PROVIDER_SITE_OTHER): Payer: 59 | Admitting: Family Medicine

## 2013-06-05 VITALS — BP 120/76 | HR 90 | Temp 98.3°F | Resp 16 | Ht 61.0 in | Wt 207.8 lb

## 2013-06-05 DIAGNOSIS — R0989 Other specified symptoms and signs involving the circulatory and respiratory systems: Secondary | ICD-10-CM

## 2013-06-05 DIAGNOSIS — R05 Cough: Secondary | ICD-10-CM

## 2013-06-05 DIAGNOSIS — R059 Cough, unspecified: Secondary | ICD-10-CM

## 2013-06-05 DIAGNOSIS — J329 Chronic sinusitis, unspecified: Secondary | ICD-10-CM

## 2013-06-05 MED ORDER — AZITHROMYCIN 250 MG PO TABS: ORAL_TABLET | ORAL | Status: DC

## 2013-06-05 MED ORDER — HYDROCOD POLST-CHLORPHEN POLST 10-8 MG/5ML PO LQCR: 5.0000 mL | Freq: Two times a day (BID) | ORAL | Status: DC | PRN

## 2013-06-05 NOTE — Patient Instructions (Addendum)
Use the azithromycin as directed for bronchitis and sinusitis Use the cough syrup as needed- be careful as it can be habit forming

## 2013-06-05 NOTE — Progress Notes (Signed)
[facility] [address], [place] [address] [phone]  Date:  06/05/2013   Name:  Robin Salas   DOB:  [birth year]   MRN:  [id number]  PCP:  Kerby Nora, MD    Chief Complaint: Cough   History of Present Illness:  Robin Salas is a 22 y.o. very pleasant female patient who presents with the following:  Here today to follow- up illness.  She got sick at the start of August.  She first noted fluid and pressure in her ears, then onset of cough.  She went to the [facility] on the 22nd. She was treated with steroids and cold medication.   She then saw Dr. Karie Georges on the 28th and was tx with doxycycline and mucinex. However, she had vomitng and had to d/c the doxycycline after 5 days.    She still feels fluid on both ears. The right ear hurts sometimes.  She has sinus congestion and a cough.  She sometimes coughs up mucus.   She noted tooth pain on the right as of yesterday.  She has sinus pain.   No fever.    She is generally healthy.   Her LMP was 8/6- she has never been SA- "I'm a virgin.  There is no way I could be pregnant."  She just started back on OCP for PCOS.   Patient Active Problem List   Diagnosis Date Noted  . Acute bronchitis 05/26/2013  . Eustachian tube dysfunction 05/26/2013  . PCOS (polycystic ovarian syndrome), likely 11/22/2012  . Amenorrhea 11/05/2012  . Hyperhidrosis, focal, primary 01/22/2011    Past Medical History  Diagnosis Date  . Hyperhydrosis disorder   . Anemia     No past surgical history on file.  History  Substance Use Topics  . Smoking status: Never Smoker   . Smokeless tobacco: Never Used  . Alcohol Use: No    No family history on file.  Allergies  Allergen Reactions  . Amoxicillin Hives  . Penicillins Hives    Medication list has been reviewed and updated.  Current Outpatient Prescriptions on File Prior to Visit  Medication Sig Dispense Refill  . chlorpheniramine-HYDROcodone (TUSSIONEX PENNKINETIC ER)  10-8 MG/5ML LQCR Take 5 mLs by mouth every 12 (twelve) hours as needed.  140 mL  0  . desogestrel-ethinyl estradiol (APRI) 0.15-30 MG-MCG tablet Take 1 tablet by mouth daily.  3 Package  3  . glycopyrrolate (ROBINUL) 2 MG tablet Take 1 tablet (2 mg total) by mouth 2 (two) times daily.  180 tablet  3  . doxycycline (VIBRA-TABS) 100 MG tablet Take 1 tablet (100 mg total) by mouth 2 (two) times daily.  20 tablet  0  . fluticasone (FLONASE) 50 MCG/ACT nasal spray 2 sprays/nostril BID  16 g  2  . methylPREDNISolone (MEDROL DOSEPAK) 4 MG tablet Use as directed  21 tablet  0  . Pseudoephedrine-Ibuprofen (ADVIL COLD & SINUS LIQUI-GELS) 30-200 MG CAPS 2 tabs PO QID PRN  84 each  0   No current facility-administered medications on file prior to visit.    Review of Systems:  As per HPI- otherwise negative.   Physical Examination: Filed Vitals:   06/05/13 1606  BP: 120/76  Pulse: 100  Temp: 98.3 F (36.8 C)  Resp: 16   Filed Vitals:   06/05/13 1606  Height: 5\' 1"  (1.549 m)  Weight: 207 lb 12.8 oz (94.257 kg)   Body mass index is 39.28 kg/(m^2). Ideal Body Weight:  Weight in (lb) to have BMI = 25: 132  GEN: WDWN, NAD, Non-toxic, A & O x 3, overweight HEENT: Atraumatic, Normocephalic. Neck supple. No masses, No LAD.  Bilateral TM wnl, oropharynx normal.  PEERL,EOMI.   Nasal congestion and discharge Ears and Nose: No external deformity. CV: RRR, No M/G/R. No JVD. No thrill. No extra heart sounds. PULM: CTA B, no wheezes, crackles, rhonchi. No retractions. No resp. distress. No accessory muscle use. EXTR: No c/c/e NEURO Normal gait.  PSYCH: Normally interactive. Conversant. Not depressed or anxious appearing.  Calm demeanor.   [facility] reading (PRIMARY) by  Dr. Patsy Lager. CXR:   negative  Assessment and Plan: Sinus infection - Plan: azithromycin (ZITHROMAX) 250 MG tablet  Cough - Plan: DG Chest 2 View, azithromycin (ZITHROMAX) 250 MG tablet, chlorpheniramine-HYDROcodone (TUSSIONEX  PENNKINETIC ER) 10-8 MG/5ML LQCR  Chest congestion - Plan: DG Chest 2 View  She is to let me know if not better in the next few days- Sooner if worse.  Azithromycin as directed and tussionex prn.     Signed Abbe Amsterdam, MD

## 2013-07-15 ENCOUNTER — Telehealth: Payer: Self-pay | Admitting: Family Medicine

## 2013-07-15 NOTE — Telephone Encounter (Signed)
Error

## 2013-07-19 ENCOUNTER — Encounter: Payer: Self-pay | Admitting: Family Medicine

## 2013-07-19 ENCOUNTER — Encounter (INDEPENDENT_AMBULATORY_CARE_PROVIDER_SITE_OTHER): Payer: Self-pay

## 2013-07-19 ENCOUNTER — Ambulatory Visit (INDEPENDENT_AMBULATORY_CARE_PROVIDER_SITE_OTHER): Payer: 59 | Admitting: Family Medicine

## 2013-07-19 VITALS — BP 120/76 | HR 90 | Temp 98.2°F | Ht 61.0 in | Wt 201.5 lb

## 2013-07-19 DIAGNOSIS — R110 Nausea: Secondary | ICD-10-CM

## 2013-07-19 NOTE — Assessment & Plan Note (Signed)
Will eval for Hep A. Otherwise if this is negative, symptoms are likely due to recent illness and med SE.

## 2013-07-19 NOTE — Progress Notes (Signed)
  Subjective:    Patient ID: Robin Salas, female    DOB: 10/12/1990, 22 y.o.   MRN: [id number]  HPI  22 year old previously healthy  College student female with PCOS  began feeling poorly in August.  She has been exposed to her mother with hep A.  She does not live in the same house but has stayed there several times.   She had started back on OCPs... Started with nausea, vomiting, diarrhea and fatigue.  Had URI at the same time. ( took three roundds of antibiotics and one steroid course)  She blamed the symptoms on OCPs and URI.   She is still with decreased appetite, some milder nausea. Almost back to normal.  She wishes to be tested for hep A.     Review of Systems  Constitutional: Negative for fever and fatigue.  HENT: Negative for ear pain.   Eyes: Negative for pain.  Respiratory: Negative for chest tightness and shortness of breath.   Cardiovascular: Negative for chest pain, palpitations and leg swelling.  Gastrointestinal: Negative for abdominal pain.  Genitourinary: Negative for dysuria.       Objective:   Physical Exam  Constitutional: Vital signs are normal. She appears well-developed and well-nourished. She is cooperative.  Non-toxic appearance. She does not appear ill. No distress.  HENT:  Head: Normocephalic.  Right Ear: Hearing, tympanic membrane, external ear and ear canal normal. Tympanic membrane is not erythematous, not retracted and not bulging.  Left Ear: Hearing, tympanic membrane, external ear and ear canal normal. Tympanic membrane is not erythematous, not retracted and not bulging.  Nose: No mucosal edema or rhinorrhea. Right sinus exhibits no maxillary sinus tenderness and no frontal sinus tenderness. Left sinus exhibits no maxillary sinus tenderness and no frontal sinus tenderness.  Mouth/Throat: Uvula is midline, oropharynx is clear and moist and mucous membranes are normal.  Eyes: Conjunctivae, EOM and lids are normal. Pupils are equal, round, and  reactive to light. Lids are everted and swept, no foreign bodies found.  Neck: Trachea normal and normal range of motion. Neck supple. Carotid bruit is not present. No mass and no thyromegaly present.  Cardiovascular: Normal rate, regular rhythm, S1 normal, S2 normal, normal heart sounds, intact distal pulses and normal pulses.  Exam reveals no gallop and no friction rub.   No murmur heard. Pulmonary/Chest: Effort normal and breath sounds normal. Not tachypneic. No respiratory distress. She has no decreased breath sounds. She has no wheezes. She has no rhonchi. She has no rales.  Abdominal: Soft. Normal appearance and bowel sounds are normal. There is no tenderness.  Neurological: She is alert.  Skin: Skin is warm, dry and intact. No rash noted.  Psychiatric: Her speech is normal and behavior is normal. Judgment and thought content normal. Her mood appears not anxious. Cognition and memory are normal. She does not exhibit a depressed mood.          Assessment & Plan:

## 2013-07-19 NOTE — Patient Instructions (Addendum)
We will call with results

## 2013-07-20 LAB — HEPATITIS A ANTIBODY, IGM: Hep A IgM: NONREACTIVE

## 2013-07-24 ENCOUNTER — Other Ambulatory Visit: Payer: Self-pay | Admitting: Family Medicine

## 2013-07-27 ENCOUNTER — Ambulatory Visit: Payer: 59

## 2013-08-09 ENCOUNTER — Ambulatory Visit: Payer: 59

## 2013-09-01 ENCOUNTER — Telehealth: Payer: Self-pay | Admitting: Family Medicine

## 2013-09-01 NOTE — Telephone Encounter (Signed)
Noted  

## 2013-09-01 NOTE — Telephone Encounter (Signed)
Patient Information:  Caller Name: Vaness  Phone: [phone]  Patient: Robin Salas  Gender: Female  DOB: [birth year]  Age: 22 Years  PCP: Kerby Nora (Family Practice)  Pregnant: No  Office Follow Up:  Does the office need to follow up with this patient?: No  Instructions For The Office: N/A  RN Note:  care advice and call back parameter reviewed understanding expressed  Symptoms  Reason For Call & Symptoms: Patient thought she was having a yeast infection.  she started Monistat OTC treatment for x2 days.  When she wiped this morning she had slight pinkish color  in yeast cream. Symptoms are better.   LMP 08/21/13. No pregnancy +Birth Control.  Reviewed Health History In EMR: Yes  Reviewed Medications In EMR: Yes  Reviewed Allergies In EMR: Yes  Reviewed Surgeries / Procedures: Yes  Date of Onset of Symptoms: 08/30/2013  Treatments Tried: Monistate  Treatments Tried Worked: Yes OB / GYN:  LMP: 08/26/2013  Guideline(s) Used:  Vaginal Discharge  Disposition Per Guideline:   Home Care  Reason For Disposition Reached:   Symptoms of a vaginal yeast infection (i.e., white, thick, cottage-cheese-like, itchy, not bad smelling discharge)  Advice Given:  Antifungal Medication for Vaginal Yeast Infection:   Available in the [place]: Femstat-3, miconazole (Monistat-3), clotrimazole (Gyne-Lotrimin-3, Mycelex-7), butoconazole (Femstat-3).  Genital Hygiene:   Keep your genital area clean. Wash daily.  Keep your genital area dry. Wear cotton underwear or underwear with a cotton crotch.  Do not douche.  Do not use feminine hygiene products.  Call Back If:  You become worse.  Patient Will Follow Care Advice:  YES

## 2013-09-01 NOTE — Telephone Encounter (Signed)
Agree with recs.

## 2013-09-27 ENCOUNTER — Ambulatory Visit (INDEPENDENT_AMBULATORY_CARE_PROVIDER_SITE_OTHER): Payer: 59 | Admitting: Family Medicine

## 2013-09-27 ENCOUNTER — Encounter: Payer: Self-pay | Admitting: Family Medicine

## 2013-09-27 VITALS — BP 120/70 | HR 89 | Temp 98.3°F | Ht 61.0 in | Wt 187.5 lb

## 2013-09-27 DIAGNOSIS — N912 Amenorrhea, unspecified: Secondary | ICD-10-CM

## 2013-09-27 DIAGNOSIS — E282 Polycystic ovarian syndrome: Secondary | ICD-10-CM

## 2013-09-27 DIAGNOSIS — R110 Nausea: Secondary | ICD-10-CM

## 2013-09-27 NOTE — Assessment & Plan Note (Signed)
Continue working on exercise and weight loss.

## 2013-09-27 NOTE — Assessment & Plan Note (Signed)
Improved off APRi. Stay off med. If nausea not resolving completely will consider further eval. Hep A neg.

## 2013-09-27 NOTE — Patient Instructions (Signed)
If nausea not continuing to resolve off birth control, call. Stop apri. If menses not returning in next 6 month to a year... Make appt for follow up.

## 2013-09-27 NOTE — Progress Notes (Signed)
Pre-visit discussion using our clinic review tool. No additional management support is needed unless otherwise documented below in the visit note.  

## 2013-09-27 NOTE — Progress Notes (Signed)
   Subjective:    Patient ID: Robin Salas, female    DOB: 07-13-1991, 22 y.o.   MRN: [id number]  HPI  22 year old female presents to the office today for  Concerns that OCPs may be causing nausea.  IN the last 3 months since she has been taking Apri she has had off and on nausea... In the last 2 weeks she has had constant nausea. Episode of dizziness, lightheadedness. She stopped apri 1 week ago and she has gotten gradually better.   She was tested for hep A given her mother has had this in the last 2 months.  She was originally placed on OCPs for amenorrhea and PCOS.. Menses normalized on OCPs.  Not sexually active.  She has lost 30 lbs in the last year. She is working on [employer] and some exercise. Wt Readings from Last 3 Encounters:  09/27/13 187 lb 8 oz (85.049 kg)  07/19/13 201 lb 8 oz (91.4 kg)  06/05/13 207 lb 12.8 oz (94.257 kg)       Review of Systems  Constitutional: Negative for fever and fatigue.       Decreased appetite  HENT: Negative for ear pain.   Eyes: Negative for pain.  Respiratory: Negative for chest tightness and shortness of breath.   Cardiovascular: Negative for chest pain, palpitations and leg swelling.  Gastrointestinal: Negative for abdominal pain.  Genitourinary: Negative for dysuria.       Objective:   Physical Exam  Constitutional: Vital signs are normal. She appears well-developed and well-nourished. She is cooperative.  Non-toxic appearance. She does not appear ill. No distress.  HENT:  Head: Normocephalic.  Right Ear: Hearing, tympanic membrane, external ear and ear canal normal. Tympanic membrane is not erythematous, not retracted and not bulging.  Left Ear: Hearing, tympanic membrane, external ear and ear canal normal. Tympanic membrane is not erythematous, not retracted and not bulging.  Nose: No mucosal edema or rhinorrhea. Right sinus exhibits no maxillary sinus tenderness and no frontal sinus tenderness. Left sinus exhibits no  maxillary sinus tenderness and no frontal sinus tenderness.  Mouth/Throat: Uvula is midline, oropharynx is clear and moist and mucous membranes are normal.  Eyes: Conjunctivae, EOM and lids are normal. Pupils are equal, round, and reactive to light. Lids are everted and swept, no foreign bodies found.  Neck: Trachea normal and normal range of motion. Neck supple. Carotid bruit is not present. No mass and no thyromegaly present.  Cardiovascular: Normal rate, regular rhythm, S1 normal, S2 normal, normal heart sounds, intact distal pulses and normal pulses.  Exam reveals no gallop and no friction rub.   No murmur heard. Pulmonary/Chest: Effort normal and breath sounds normal. Not tachypneic. No respiratory distress. She has no decreased breath sounds. She has no wheezes. She has no rhonchi. She has no rales.  Abdominal: Soft. Normal appearance and bowel sounds are normal. There is no tenderness.  Neurological: She is alert.  Skin: Skin is warm, dry and intact. No rash noted.  Psychiatric: Her speech is normal and behavior is normal. Judgment and thought content normal. Her mood appears not anxious. Cognition and memory are normal. She does not exhibit a depressed mood.          Assessment & Plan:

## 2013-09-27 NOTE — Assessment & Plan Note (Signed)
If menses not returning with weight loss and exercsie ( ie treatment of PCOS in next 6 month to a year) return for further considerations of treatment.

## 2013-12-02 ENCOUNTER — Encounter: Payer: Self-pay | Admitting: Family Medicine

## 2013-12-02 ENCOUNTER — Ambulatory Visit (INDEPENDENT_AMBULATORY_CARE_PROVIDER_SITE_OTHER): Payer: 59 | Admitting: Family Medicine

## 2013-12-02 ENCOUNTER — Ambulatory Visit: Payer: Self-pay | Admitting: Family Medicine

## 2013-12-02 VITALS — BP 120/78 | HR 54 | Temp 97.9°F | Ht 61.0 in | Wt 174.0 lb

## 2013-12-02 DIAGNOSIS — R1011 Right upper quadrant pain: Secondary | ICD-10-CM

## 2013-12-02 DIAGNOSIS — R110 Nausea: Secondary | ICD-10-CM

## 2013-12-02 LAB — TSH: TSH: 0.35 u[IU]/mL (ref 0.35–5.50)

## 2013-12-02 LAB — LIPASE: Lipase: 22 U/L (ref 11.0–59.0)

## 2013-12-02 LAB — CBC WITH DIFFERENTIAL/PLATELET
BASOS ABS: 0 10*3/uL (ref 0.0–0.1)
Basophils Relative: 0.4 % (ref 0.0–3.0)
EOS ABS: 0.1 10*3/uL (ref 0.0–0.7)
EOS PCT: 2 % (ref 0.0–5.0)
HCT: 34.7 % — ABNORMAL LOW (ref 36.0–46.0)
Hemoglobin: 11.1 g/dL — ABNORMAL LOW (ref 12.0–15.0)
LYMPHS ABS: 1.6 10*3/uL (ref 0.7–4.0)
Lymphocytes Relative: 33.3 % (ref 12.0–46.0)
MCHC: 32.1 g/dL (ref 30.0–36.0)
MCV: 71.2 fl — AB (ref 78.0–100.0)
MONO ABS: 0.3 10*3/uL (ref 0.1–1.0)
Monocytes Relative: 7 % (ref 3.0–12.0)
NEUTROS PCT: 57.3 % (ref 43.0–77.0)
Neutro Abs: 2.8 10*3/uL (ref 1.4–7.7)
PLATELETS: 196 10*3/uL (ref 150.0–400.0)
RBC: 4.88 Mil/uL (ref 3.87–5.11)
RDW: 16.6 % — AB (ref 11.5–14.6)
WBC: 4.9 10*3/uL (ref 4.5–10.5)

## 2013-12-02 LAB — COMPREHENSIVE METABOLIC PANEL
ALK PHOS: 62 U/L (ref 39–117)
ALT: 29 U/L (ref 0–35)
AST: 34 U/L (ref 0–37)
Albumin: 4.3 g/dL (ref 3.5–5.2)
BILIRUBIN TOTAL: 1.5 mg/dL — AB (ref 0.3–1.2)
BUN: 9 mg/dL (ref 6–23)
CO2: 25 mEq/L (ref 19–32)
CREATININE: 0.8 mg/dL (ref 0.4–1.2)
Calcium: 10 mg/dL (ref 8.4–10.5)
Chloride: 108 mEq/L (ref 96–112)
GFR: 93.46 mL/min (ref >60.00)
Glucose, Bld: 80 mg/dL (ref 70–99)
Potassium: 4.5 mEq/L (ref 3.5–5.1)
SODIUM: 141 meq/L (ref 135–145)
TOTAL PROTEIN: 7.3 g/dL (ref 6.0–8.3)

## 2013-12-02 MED ORDER — ONDANSETRON HCL 8 MG PO TABS: 8.0000 mg | ORAL_TABLET | Freq: Every day | ORAL | Status: DC | PRN

## 2013-12-02 NOTE — Progress Notes (Signed)
Pre visit review using our clinic review tool, if applicable. No additional management support is needed unless otherwise documented below in the visit note. 

## 2013-12-02 NOTE — Patient Instructions (Addendum)
Can use zofran for nausea. Stop at lab and front desk on way out for labs and to set up US. Can also try trial of probiotics like align.

## 2013-12-02 NOTE — Assessment & Plan Note (Addendum)
TTP in RUQ.. Will eval with limited RUQ US to look for gallstones and liver issue. If neg could also consider HIDA scan for functionality of gallbladder.

## 2013-12-02 NOTE — Progress Notes (Signed)
   Subjective:    Patient ID: Robin Salas, female    DOB: 04/10/1991, 23 y.o.   MRN: [id number]  HPI  23 year old female with PCOS presents with continued nausea.  She has had nausea since 04/2013.  She has constant nausea daily, some days worse than others. Worse after she eats ANYTHING. Now can only eat grits and soup. Triggered by smells and looking at food. No regular emesis ( has vomited 3 times in last 6 months).  BMs occuring once a week in last 6 months. If she is able to eat more, she goes more. No straining, stool regular, not firm. Having daily headaches as well, mild to moderate.Shanon Rosser. Cannot use OTC med because it makes her sick to her stomach. In last month she has started with aching in stomach " severe hunger pain" No heartburn. She burps a lot.  She has lost almost 30 lbs in last 6 months. Wt Readings from Last 3 Encounters:  12/02/13 174 lb (78.926 kg)  09/27/13 187 lb 8 oz (85.049 kg)  07/19/13 201 lb 8 oz (91.4 kg)   Neg hepatitis eval. Mother had hep A last year. Seen in 12.2014 and OCPs were stopped.. With some improvement in nausea but it still remains. Last year nml CMET, TSH, DHEA, testosterone, prolactin.  She tried nexium OTC for 1-2 weeks and this made her feel more sick.  She has cut out GERD triggers.  She remembers that ever since she had several courses of antibiotics in 8-05/2013... Nausea started and never resolved.  Review of Systems  Constitutional: Positive for activity change, appetite change and fatigue. Negative for fever.  HENT: Negative for ear pain.   Eyes: Negative for pain.  Respiratory: Negative for cough and shortness of breath.   Cardiovascular: Negative for chest pain.  Gastrointestinal: Positive for nausea, abdominal pain and constipation. Negative for blood in stool.  Genitourinary: Negative for dysuria, urgency, frequency and hematuria.       Objective:   Physical Exam  Constitutional:  Overweight appearing in NAD  HENT:    Head: Normocephalic and atraumatic.  Right Ear: External ear normal.  Left Ear: External ear normal.  Mouth/Throat: Oropharynx is clear and moist. No oropharyngeal exudate.  Eyes: Conjunctivae are normal. Pupils are equal, round, and reactive to light. Right eye exhibits no discharge.  Neck: Normal range of motion. Neck supple. No thyromegaly present.  Cardiovascular: Normal rate and regular rhythm.   No murmur heard. Pulmonary/Chest: Effort normal and breath sounds normal. No respiratory distress. She has no wheezes. She has no rales. She exhibits no tenderness.  Abdominal: Bowel sounds are normal. She exhibits no distension and no mass. There is tenderness in the right upper quadrant and epigastric area. There is no rebound and no guarding.  Skin: Skin is warm and dry.  Psychiatric: Her mood appears not anxious. She does not exhibit a depressed mood.          Assessment & Plan:

## 2013-12-02 NOTE — Assessment & Plan Note (Signed)
Some better off OCPs but remain and pt continuing to lose weight.  Will eval with labs and US.  Eval for Hpylori.  If initial work up negative will refer to GI for ? Endoscopy.

## 2013-12-05 ENCOUNTER — Encounter: Payer: Self-pay | Admitting: Family Medicine

## 2013-12-05 ENCOUNTER — Telehealth: Payer: Self-pay | Admitting: *Deleted

## 2013-12-05 DIAGNOSIS — R110 Nausea: Secondary | ICD-10-CM

## 2013-12-05 NOTE — Telephone Encounter (Signed)
Marchelle Folksmanda notified on labs and ultrasound results.  She is agreeable to being referred to GI for further evaluation.  Advised Shirlee LimerickMarion or Bonita QuinLinda would be in touch once they have this appointment scheduled for her.  Referral ordered in EPIC.

## 2013-12-08 ENCOUNTER — Encounter: Payer: Self-pay | Admitting: Gastroenterology

## 2013-12-08 NOTE — Addendum Note (Signed)
Addended by: Alvina ChouWALSH, TERRI J on: 12/08/2013 05:16 PM   Modules accepted: Orders

## 2013-12-16 ENCOUNTER — Ambulatory Visit (INDEPENDENT_AMBULATORY_CARE_PROVIDER_SITE_OTHER): Payer: 59 | Admitting: Gastroenterology

## 2013-12-16 ENCOUNTER — Encounter: Payer: Self-pay | Admitting: Gastroenterology

## 2013-12-16 VITALS — BP 108/62 | HR 86 | Ht 61.0 in | Wt 177.6 lb

## 2013-12-16 DIAGNOSIS — K5900 Constipation, unspecified: Secondary | ICD-10-CM

## 2013-12-16 DIAGNOSIS — R110 Nausea: Secondary | ICD-10-CM

## 2013-12-16 DIAGNOSIS — R1011 Right upper quadrant pain: Secondary | ICD-10-CM

## 2013-12-16 NOTE — Patient Instructions (Signed)
Miralax every 3 days if no bowel movement

## 2013-12-16 NOTE — Assessment & Plan Note (Signed)
Patient's right upper quadrant and diffuse abdominal pain is also likely to 2 constipation.    Recommendations #1 patient was instructed to take MiraLax every 3 days if no bowel movement #2 Robinul as needed

## 2013-12-16 NOTE — Assessment & Plan Note (Signed)
Symptoms very likely due to the estrogens in her oral contraceptive.  Nausea has subsided.

## 2013-12-16 NOTE — Assessment & Plan Note (Signed)
Constipation is related to change in diet. Tissue was encouraged to increase fiber in her diet and to use MiraLax as needed.

## 2013-12-16 NOTE — Progress Notes (Signed)
    _                                                                                                                History of Present Illness: 23 year old white female referred for evaluation of nausea.  Approximately 6 months ago she developed severe free standing nausea after beginning an oral contraceptive.  She lost almost 40 pounds over the next 4 months.  She discontinued the oral contraceptive in December.  Nausea subsided except for mild persistance postprandially.  Since taking Zofran 2-3 weeks ago nausea has entirely subsided.  She stoped Zofran several days ago and has had no recurrences.  With eating, however, she has developed postprandial abdominal discomfort.  6 months ago she was having a daily bowel movement.  She moves her bowels less than once a week.  Pain is diffuse and somewhat crampy.  There is no bleeding.    Past Medical History  Diagnosis Date  . Hyperhydrosis disorder   . Anemia    History reviewed. No pertinent past surgical history. family history includes Bone cancer in her maternal grandfather; Breast cancer in her maternal aunt; Prostate cancer in her maternal grandfather. Current Outpatient Prescriptions  Medication Sig Dispense Refill  . glycopyrrolate (ROBINUL) 2 MG tablet TAKE 1 TABLET TWICE DAILY  180 tablet  1  . ondansetron (ZOFRAN) 8 MG tablet Take 1 tablet (8 mg total) by mouth daily as needed for nausea or vomiting.  30 tablet  0   No current facility-administered medications for this visit.   Allergies as of 12/16/2013 - Review Complete 12/16/2013  Allergen Reaction Noted  . Amoxicillin Hives 01/01/2009  . Penicillins Hives 01/01/2009    reports that she has never smoked. She has never used smokeless tobacco. She reports that she does not drink alcohol or use illicit drugs.     Review of Systems: Pertinent positive and negative review of systems were noted in the above HPI section. All other review of systems were otherwise  negative.  Vital signs were reviewed in today's medical record Physical Exam: General: Well developed , well nourished, no acute distress Skin: anicteric Head: Normocephalic and atraumatic Eyes:  sclerae anicteric, EOMI Ears: Normal auditory acuity Mouth: No deformity or lesions Neck: Supple, no masses or thyromegaly Lungs: Clear throughout to auscultation Heart: Regular rate and rhythm; no murmurs, rubs or bruits Abdomen: Soft, non tender and non distended. No masses, hepatosplenomegaly or hernias noted. Normal Bowel sounds Rectal:deferred Musculoskeletal: Symmetrical with no gross deformities  Skin: No lesions on visible extremities Pulses:  Normal pulses noted Extremities: No clubbing, cyanosis, edema or deformities noted Neurological: Alert oriented x 4, grossly nonfocal Cervical Nodes:  No significant cervical adenopathy Inguinal Nodes: No significant inguinal adenopathy Psychological:  Alert and cooperative. Normal mood and affect  See Assessment and Plan under Problem List

## 2014-03-02 ENCOUNTER — Encounter: Payer: Self-pay | Admitting: Internal Medicine

## 2014-03-02 ENCOUNTER — Ambulatory Visit: Payer: 59 | Admitting: Family Medicine

## 2014-03-02 ENCOUNTER — Ambulatory Visit (INDEPENDENT_AMBULATORY_CARE_PROVIDER_SITE_OTHER): Payer: 59 | Admitting: Internal Medicine

## 2014-03-02 VITALS — BP 118/72 | HR 89 | Temp 98.1°F | Wt 169.5 lb

## 2014-03-02 DIAGNOSIS — R110 Nausea: Secondary | ICD-10-CM

## 2014-03-02 LAB — POCT URINE PREGNANCY: Preg Test, Ur: NEGATIVE

## 2014-03-02 MED ORDER — PANTOPRAZOLE SODIUM 40 MG PO TBEC: 40.0000 mg | DELAYED_RELEASE_TABLET | Freq: Every day | ORAL | Status: DC

## 2014-03-02 MED ORDER — ONDANSETRON HCL 8 MG PO TABS: 8.0000 mg | ORAL_TABLET | Freq: Every day | ORAL | Status: DC | PRN

## 2014-03-02 NOTE — Progress Notes (Signed)
Pre visit review using our clinic review tool, if applicable. No additional management support is needed unless otherwise documented below in the visit note. 

## 2014-03-02 NOTE — Progress Notes (Signed)
Subjective:    Patient ID: Robin Salas, female    DOB: 06/30/1991, 23 y.o.   MRN: [id number]  HPI  Pt presents to the clinic today with c/o nausea. This has been a chronic issue over the last year. It occurs daily. Rare vomiting. Has loose BM after eating. Seems to be triggered by eating and smells. She has been loosing weight. She did have improvement for short period of time after stopping OCP's. She is taking pepto bismol without much relief. She has had normal blood workup, including negative H pylori. US of RUQ showed mild fatty infiltration of the liver. Dr. Ermalene SearingBedsole did offer referral to GI for further evaluation. She saw Dr. Arlyce DiceKaplan 12/16/13. She did have improvement with zofran but the zofran seemed to cause severe constipation. Dr. Arlyce DiceKaplan started her on miralax and robinul.  Review of Systems      Past Medical History  Diagnosis Date  . Hyperhydrosis disorder   . Anemia     Current Outpatient Prescriptions  Medication Sig Dispense Refill  . glycopyrrolate (ROBINUL) 2 MG tablet TAKE 1 TABLET TWICE DAILY  180 tablet  1   No current facility-administered medications for this visit.    Allergies  Allergen Reactions  . Amoxicillin Hives  . Penicillins Hives    Family History  Problem Relation Age of Onset  . Prostate cancer Maternal Grandfather   . Bone cancer Maternal Grandfather   . Breast cancer Maternal Aunt     History   Social History  . Marital Status: Single    Spouse Name: N/A    Number of Children: N/A  . Years of Education: N/A   Occupational History  . Not on file.   Social History Main Topics  . Smoking status: Never Smoker   . Smokeless tobacco: Never Used  . Alcohol Use: No  . Drug Use: No  . Sexual Activity: No   Other Topics Concern  . Not on file   Social History Narrative  . No narrative on file     Constitutional: Pt reports weight loss. Denies fever, malaise, fatigue, headache.  Gastrointestinal: Pt reports nausea. Denies  abdominal pain, bloating, constipation, diarrhea or blood in the stool.  GU: Denies urgency, frequency, pain with urination, burning sensation, blood in urine, odor or discharge.   No other specific complaints in a complete review of systems (except as listed in HPI above).  Objective:   Physical Exam  BP 118/72  Pulse 89  Temp(Src) 98.1 F (36.7 C) (Oral)  Wt 169 lb 8 oz (76.885 kg)  SpO2 99%  LMP 02/16/2014 Wt Readings from Last 3 Encounters:  03/02/14 169 lb 8 oz (76.885 kg)  12/16/13 177 lb 9.6 oz (80.559 kg)  12/02/13 174 lb (78.926 kg)    General: Appears her stated age, well developed, well nourished in NAD. Cardiovascular: Normal rate and rhythm. S1,S2 noted.  No murmur, rubs or gallops noted. No JVD or BLE edema. No carotid bruits noted. Pulmonary/Chest: Normal effort and positive vesicular breath sounds. No respiratory distress. No wheezes, rales or ronchi noted.  Abdomen: Soft and tender in the RLQ. Normal bowel sounds, no bruits noted. No distention or masses noted. Liver, spleen and kidneys non palpable.  BMET    Component Value Date/Time   NA 141 12/02/2013 1154   K 4.5 12/02/2013 1154   CL 108 12/02/2013 1154   CO2 25 12/02/2013 1154   GLUCOSE 80 12/02/2013 1154   BUN 9 12/02/2013 1154   CREATININE  0.8 12/02/2013 1154   CALCIUM 10.0 12/02/2013 1154    Lipid Panel     Component Value Date/Time   CHOL 181 11/19/2012 0815   TRIG 116.0 11/19/2012 0815   HDL 40.40 11/19/2012 0815   CHOLHDL 4 11/19/2012 0815   VLDL 23.2 11/19/2012 0815   LDLCALC 117* 11/19/2012 0815    CBC    Component Value Date/Time   WBC 4.9 12/02/2013 1154   RBC 4.88 12/02/2013 1154   HGB 11.1* 12/02/2013 1154   HCT 34.7* 12/02/2013 1154   PLT 196.0 12/02/2013 1154   MCV 71.2* 12/02/2013 1154   MCHC 32.1 12/02/2013 1154   RDW 16.6* 12/02/2013 1154   LYMPHSABS 1.6 12/02/2013 1154   MONOABS 0.3 12/02/2013 1154   EOSABS 0.1 12/02/2013 1154   BASOSABS 0.0 12/02/2013 1154    Hgb A1C Lab Results  Component Value Date     HGBA1C 5.8 11/19/2012         Assessment & Plan:   Nausea, chronic:  Will try a PPI to see if this helps at all eRx for protonix and zofran  Follow up with Dr. Arlyce Dice for further evaluation  RTC as needed or if symptoms persist or worsen

## 2014-03-02 NOTE — Patient Instructions (Addendum)
Nausea, Adult Nausea is the feeling that you have an upset stomach or have to vomit. Nausea by itself is not likely a serious concern, but it may be an early sign of more serious medical problems. As nausea gets worse, it can lead to vomiting. If vomiting develops, there is the risk of dehydration.  CAUSES   Viral infections.  Food poisoning.  Medicines.  Pregnancy.  Motion sickness.  Migraine headaches.  Emotional distress.  Severe pain from any source.  Alcohol intoxication. HOME CARE INSTRUCTIONS  Get plenty of rest.  Ask your caregiver about specific rehydration instructions.  Eat small amounts of food and sip liquids more often.  Take all medicines as told by your caregiver. SEEK MEDICAL CARE IF:  You have not improved after 2 days, or you get worse.  You have a headache. SEEK IMMEDIATE MEDICAL CARE IF:   You have a fever.  You faint.  You keep vomiting or have blood in your vomit.  You are extremely weak or dehydrated.  You have dark or bloody stools.  You have severe chest or abdominal pain. MAKE SURE YOU:  Understand these instructions.  Will watch your condition.  Will get help right away if you are not doing well or get worse. Document Released: 10/23/2004 Document Revised: 06/09/2012 Document Reviewed: 05/28/2011 Exit[employer] Patient Information 2014 [employer].  

## 2014-03-02 NOTE — Addendum Note (Signed)
Addended by: Roena Malady on: 03/02/2014 01:02 PM   Modules accepted: Orders

## 2014-03-16 ENCOUNTER — Telehealth: Payer: Self-pay

## 2014-03-16 DIAGNOSIS — E282 Polycystic ovarian syndrome: Secondary | ICD-10-CM

## 2014-03-16 NOTE — Telephone Encounter (Signed)
Pt left /vm was diagnosed with polycystic ovarian syndrome by Dr Ermalene SearingBedsole and pt request referral to endocrinologist. Dr Felipa EmoryMichael Opheimer.Please advise.

## 2014-03-29 ENCOUNTER — Other Ambulatory Visit: Payer: Self-pay | Admitting: Family Medicine

## 2014-03-29 NOTE — Telephone Encounter (Signed)
Last office visit 03/02/2014 with Robin Salas.  Ok to refill?

## 2014-03-30 ENCOUNTER — Other Ambulatory Visit: Payer: Self-pay | Admitting: Family Medicine

## 2014-03-30 NOTE — Telephone Encounter (Signed)
What does she use this for?

## 2014-03-30 NOTE — Telephone Encounter (Signed)
According to her chart she is taking Robinul for hyperhidrosis.

## 2014-04-04 ENCOUNTER — Telehealth: Payer: Self-pay | Admitting: Family Medicine

## 2014-04-04 DIAGNOSIS — E282 Polycystic ovarian syndrome: Secondary | ICD-10-CM

## 2014-04-04 NOTE — Telephone Encounter (Signed)
Pt request referral to see endodontologist. Pt was diagnosed with polycystic ovarian disease and would like to [facility] [place]

## 2014-04-04 NOTE — Telephone Encounter (Signed)
Sorry, endocrinologist!

## 2014-05-10 ENCOUNTER — Encounter: Payer: Self-pay | Admitting: Internal Medicine

## 2014-05-10 ENCOUNTER — Ambulatory Visit (INDEPENDENT_AMBULATORY_CARE_PROVIDER_SITE_OTHER): Payer: 59 | Admitting: Internal Medicine

## 2014-05-10 VITALS — BP 112/68 | HR 103 | Temp 97.9°F | Resp 12 | Ht 62.0 in | Wt 167.0 lb

## 2014-05-10 DIAGNOSIS — E282 Polycystic ovarian syndrome: Secondary | ICD-10-CM

## 2014-05-10 DIAGNOSIS — R110 Nausea: Secondary | ICD-10-CM

## 2014-05-10 DIAGNOSIS — N912 Amenorrhea, unspecified: Secondary | ICD-10-CM

## 2014-05-10 DIAGNOSIS — Z862 Personal history of diseases of the blood and blood-forming organs and certain disorders involving the immune mechanism: Secondary | ICD-10-CM

## 2014-05-10 LAB — IBC PANEL
IRON: 23 ug/dL — AB (ref 42–145)
Saturation Ratios: 4.3 % — ABNORMAL LOW (ref 20.0–50.0)
Transferrin: 380.6 mg/dL — ABNORMAL HIGH (ref 212.0–360.0)

## 2014-05-10 LAB — CBC
HEMATOCRIT: 33.1 % — AB (ref 36.0–46.0)
HEMOGLOBIN: 10.8 g/dL — AB (ref 12.0–15.0)
MCHC: 32.6 g/dL (ref 30.0–36.0)
MCV: 71.3 fl — AB (ref 78.0–100.0)
Platelets: 219 10*3/uL (ref 150.0–400.0)
RBC: 4.64 Mil/uL (ref 3.87–5.11)
RDW: 16.2 % — AB (ref 11.5–15.5)
WBC: 6.4 10*3/uL (ref 4.0–10.5)

## 2014-05-10 LAB — VITAMIN D 25 HYDROXY (VIT D DEFICIENCY, FRACTURES): VITD: 26.36 ng/mL — AB (ref 30.00–100.00)

## 2014-05-10 LAB — HEMOGLOBIN A1C: Hgb A1c MFr Bld: 5.7 % (ref 4.6–6.5)

## 2014-05-10 NOTE — Patient Instructions (Signed)
Please stop at the lab. Please return in 1 year.  Polycystic Ovarian Syndrome Polycystic ovarian syndrome (PCOS) is a common hormonal disorder among women of reproductive age. Most women with PCOS grow many small cysts on their ovaries. PCOS can cause problems with your periods and make it difficult to get pregnant. It can also cause an increased risk of miscarriage with pregnancy. If left untreated, PCOS can lead to serious health problems, such as diabetes and heart disease. CAUSES The cause of PCOS is not fully understood, but genetics may be a factor. SIGNS AND SYMPTOMS   Infrequent or no menstrual periods.   Inability to get pregnant (infertility) because of not ovulating.   Increased growth of hair on the face, chest, stomach, back, thumbs, thighs, or toes.   Acne, oily skin, or dandruff.   Pelvic pain.   Weight gain or obesity, usually carrying extra weight around the waist.   Type 2 diabetes.   High cholesterol.   High blood pressure.   Female-pattern baldness or thinning hair.   Patches of thickened and dark brown or black skin on the neck, arms, breasts, or thighs.   Tiny excess flaps of skin (skin tags) in the armpits or neck area.   Excessive snoring and having breathing stop at times while asleep (sleep apnea).   Deepening of the voice.   Gestational diabetes when pregnant.  DIAGNOSIS  There is no single test to diagnose PCOS.   Your health care provider will:   Take a medical history.   Perform a pelvic exam.   Have ultrasonography done.   Check your female and female hormone levels.   Measure glucose or sugar levels in the blood.   Do other blood tests.   If you are producing too many female hormones, your health care provider will make sure it is from PCOS. At the physical exam, your health care provider will want to evaluate the areas of increased hair growth. Try to allow natural hair growth for a few days before the visit.    During a pelvic exam, the ovaries may be enlarged or swollen because of the increased number of small cysts. This can be seen more easily by using vaginal ultrasonography or screening to examine the ovaries and lining of the uterus (endometrium) for cysts. The uterine lining may become thicker if you have not been having a regular period.  TREATMENT  Because there is no cure for PCOS, it needs to be managed to prevent problems. Treatments are based on your symptoms. Treatment is also based on whether you want to have a baby or whether you need contraception.  Treatment may include:   Progesterone hormone to start a menstrual period.   Birth control pills to make you have regular menstrual periods.   Medicines to make you ovulate, if you want to get pregnant.   Medicines to control your insulin.   Medicine to control your blood pressure.   Medicine and diet to control your high cholesterol and triglycerides in your blood.  Medicine to reduce excessive hair growth.  Surgery, making small holes in the ovary, to decrease the amount of female hormone production. This is done through a long, lighted tube (laparoscope) placed into the pelvis through a tiny incision in the lower abdomen.  HOME CARE INSTRUCTIONS  Only take over-the-counter or prescription medicine as directed by your health care provider.  Pay attention to the foods you eat and your activity levels. This can help reduce the effects of PCOS.  Keep your weight under control.  Eat foods that are low in carbohydrate and high in fiber.  Exercise regularly. SEEK MEDICAL CARE IF:  Your symptoms do not get better with medicine.  You have new symptoms. Document Released: 01/09/2005 Document Revised: 07/06/2013 Document Reviewed: 03/03/2013 [facility] Patient Information 2015 [place] [place]. This information is not intended to replace advice given to you by your health care provider. Make sure you discuss any questions  you have with your health care provider.

## 2014-05-10 NOTE — Progress Notes (Signed)
Patient ID: Robin Salas, female   DOB: 01/31/1991, 23 y.o.   MRN: [id number]  HPI: Robin Salas is a 23 y.o. female, referred by Dr Ermalene SearingBedsole, in consultation for amenorrhea, suspicion for PCOS.  Last year she was investigated for amenorrhea x 4 mo >> labs checked >> high testosterone >> started OCP ([facility]) in 11/2013 >> took them for few mo >> stopped >> amenorrhea again >> restarted OCPs in 04/2014 >> nausea in 05/2014 >> got worse over the next 3 mo >> lost 50 lbs as she could not eat or drink. She stopped OCP in 12/25/015 >> nausea got better but occasionally still had nausea >> saw PCP in 09/2013 >> continued off OCPs >> menses became regular and they continue to be regular since then. She went back to PCP in 11/2013 >> gall bladder U/S >> GB normal, but fatty liver >> saw GI (Dr. Arlyce DiceKaplan) 11/2013 >> told that this was likely OCP getting out of her system >> saw PCP again in 02/2014 >> started Protonix >> nausea much better (occasionally only needs Zofran). No AP, but has constipation - improved.   Weight gain: - no, but weight loss (see above) - no recently steroid use (1 Pred taper) - no weight loss meds - Diets tried: none - Exercise: not regular  Fertility/Menstrual cycles: - regular menses now, last menstrual cycle 04/12/2014 - no h/o ovarian cysts - children: 0 - miscarriages: 0 - contraception: no, not sexually active  Acne: - no  Hirsutism: - no - few hairs on chest  Treatments tried: - did not try Metformin - did not try Spironolactone - did not try Vaniqua - not on OCPs now  Reviewed pertinent labs: Component     Latest Ref Rng 11/05/2012 11/19/2012  Testosterone     10 - 70 ng/dL 52   Sex Hormone Binding     18 - 114 nmol/L 16 (L)   Testosterone Free     0.6 - 6.8 pg/mL 13.4 (H)   Testosterone-% Free     0.4 - 2.4 % 2.6 (H)   Preg Test, Ur      Negative   TSH     0.350 - 4.500 uIU/mL 0.805   LH      15.3   FSH      5.3   DHEA     102 - 1185 ng/dL  [id number]   Prolactin       17.6   - Last thyroid tests: Lab Results  Component Value Date   TSH 0.35 12/02/2013   - Last set of lipids:    Component Value Date/Time   CHOL 181 11/19/2012 0815   TRIG 116.0 11/19/2012 0815   HDL 40.40 11/19/2012 0815   CHOLHDL 4 11/19/2012 0815   VLDL 23.2 11/19/2012 0815   LDLCALC 117* 11/19/2012 0815   - Last HbA1c: Lab Results  Component Value Date   HGBA1C 5.8 11/19/2012   She also has fatty liver, last LFTs normal: Component     Latest Ref Rng 12/02/2013  Total Bilirubin     0.3 - 1.2 mg/dL 1.5 (H)  Alkaline Phosphatase     39 - 117 U/L 62  AST     0 - 37 U/L 34  ALT     0 - 35 U/L 29  Total Protein     6.0 - 8.3 g/dL 7.3  Albumin     3.5 - 5.2 g/dL 4.3   Pt has no FH of DM.  ROS: Constitutional: no weight gain, + fatigue, no subjective hyperthermia/hypothermia, + poor sleep Eyes: no blurry vision, no xerophthalmia ENT: no sore throat, no nodules palpated in throat, no dysphagia/odynophagia, no hoarseness Cardiovascular: no CP/SOB/palpitations/leg swelling Respiratory: no cough/SOB Gastrointestinal: + N/no V/D/C Musculoskeletal: no muscle/joint aches Skin: no acne, no hair on face, + hair loss Neurological: no tremors/numbness/tingling/dizziness, + HA Psychiatric: no depression/anxiety  Past Medical History  Diagnosis Date  . Hyperhydrosis disorder   . Anemia    History reviewed. No pertinent past surgical history. History   Social History  . Marital Status: Single, will be engaged soon    Spouse Name: N/A    Number of Children: 0   Occupational History  . student   Social History Main Topics  . Smoking status: Never Smoker   . Smokeless tobacco: Never Used  . Alcohol Use: No  . Drug Use: No  . Sexual Activity: No   Current Outpatient Prescriptions on File Prior to Visit  Medication Sig Dispense Refill  . glycopyrrolate (ROBINUL) 2 MG tablet TAKE 1 TABLET TWICE DAILY  180 tablet  1  . ondansetron (ZOFRAN) 8 MG tablet Take  1 tablet (8 mg total) by mouth daily as needed for nausea or vomiting.  30 tablet  0  . pantoprazole (PROTONIX) 40 MG tablet Take 1 tablet (40 mg total) by mouth daily.  30 tablet  3   No current facility-administered medications on file prior to visit.   Allergies  Allergen Reactions  . Amoxicillin Hives  . Penicillins Hives   Family History  Problem Relation Age of Onset  . Prostate cancer Maternal Grandfather   . Bone cancer Maternal Grandfather   . Breast cancer Maternal Aunt    PE: BP 112/68  Pulse 103  Temp(Src) 97.9 F (36.6 C) (Oral)  Resp 12  Ht 5\' 2"  (1.575 m)  Wt 167 lb (75.751 kg)  BMI 30.54 kg/m2  SpO2 96%  LMP 04/18/2014 Wt Readings from Last 3 Encounters:  05/10/14 167 lb (75.751 kg)  03/02/14 169 lb 8 oz (76.885 kg)  12/16/13 177 lb 9.6 oz (80.559 kg)   Constitutional: overweight, in NAD, no full supraclavicular fat pads Eyes: PERRLA, EOMI, no exophthalmos ENT: moist mucous membranes, no thyromegaly, no cervical lymphadenopathy Cardiovascular: RRR, No MRG Respiratory: CTA B Gastrointestinal: abdomen soft, NT, ND, BS+ Musculoskeletal: no deformities, strength intact in all 4 Skin: moist, warm; no acne on face, no dark terminal hair on chin, no vellum on sideburns, no skin tags, no acanthosis nigricans, no purple, wide, stretch marks Neurological: no tremor with outstretched hands, DTR normal in all 4  ASSESSMENT: 1. H/o amenorrhea, high testosterone - likely PCOS  2. Nausea - improved  PLAN: 1. Based on clinical hoistory and lab results (see HPI: amenorrhea, high testosterone, LH:FSH 3:1), I believe the pt has PCOS, but this is ina  Period of remission now 2/2 to her weight loss. I had a long discussion with the patient about the fact that the PCOS is a misnomer, a patient does not necessarily have to have polycystic ovaries to be diagnosed with the disorder. This is of sum of several conditions, including:  weight gain  insulin resistance (and  therefore a higher risk of developing diabetes later in life)  acne  hirsutism  irregular menstrual cycles  decreased fertility. - We also discussed about the fact that the treatment is usually targeted to addressing the problem that concerns the patient the most: acne/hirsutism, weight gain, or fertility, but there  is no single treatment for PCOS.  - The first-line therapy are oral contraceptives. If she is concerned with her weight, we can use metformin; if she is concerned about acne/hirsutism, we can add spironolactone; and if she is concerned about fertility, I could refer her to reproductive endocrinology for possible use of clomiphene. - she is not bothered by weight gain (she lost a lot of weight duet to her GI sxs, she does not have hirsutism or acne and her menses are regular after losing the weight. In this situation, she does not require pharmaceutic treatment, but I advised her to continue to watch her diet and to exercise so she can keep the weight off. She may require contraception soon >> she can try another OCP, an IUD, an implantable one or other method of contraception. - I will check today a testosterone level and a HbA1c, but I expect these are both improved after weight loss  2. Nausea - with h/o anemia, without heavy menses - I would like to check her for celiac ds - will also check a vit D and another CBC and IBC panel  Component     Latest Ref Rng 05/10/2014  WBC     4.0 - 10.5 K/uL 6.4  RBC     3.87 - 5.11 Mil/uL 4.64  Hemoglobin     12.0 - 15.0 g/dL 45.4 (L)  HCT     09.8 - 46.0 % 33.1 (L)  MCV     78.0 - 100.0 fl 71.3 (L)  MCHC     30.0 - 36.0 g/dL 11.9  RDW     14.7 - 82.9 % 16.2 (H)  Platelets     150.0 - 400.0 K/uL 219.0  Testosterone     10 - 70 ng/dL 59  Sex Hormone Binding     18 - 114 nmol/L 32  Testosterone Free     0.6 - 6.8 pg/mL 10.9 (H)  Testosterone-% Free     0.4 - 2.4 % 1.8  Iron     42 - 145 ug/dL 23 (L)  Transferrin     212.0  - 360.0 mg/dL 562.1 (H)  Saturation Ratios     20.0 - 50.0 % 4.3 (L)  Gliadin IgG     <20 U/mL 2.0  Gliadin IgA     <20 U/mL 6.9  Reticulin Ab, IgA     NEGATIVE NEGATIVE  Reticulin IgA titer     <1:2.5 SEE NOTE  Hemoglobin A1C     4.6 - 6.5 % 5.7  Tissue Transglutaminase Ab, IgA     <20 U/mL 6.3  VITD     30.00 - 100.00 ng/mL 26.36 (L)   Pt with anemia, normal celiac tests, slightly low vitamin D, and high testosterone, although improved after weight loss. HbA1c lower.

## 2014-05-11 LAB — TESTOSTERONE, FREE, TOTAL, SHBG
SEX HORMONE BINDING: 32 nmol/L (ref 18–114)
TESTOSTERONE FREE: 10.9 pg/mL — AB (ref 0.6–6.8)
Testosterone-% Free: 1.8 % (ref 0.4–2.4)
Testosterone: 59 ng/dL (ref 10–70)

## 2014-05-11 LAB — GLIADIN ANTIBODIES, SERUM
GLIADIN IGA: 6.9 U/mL (ref <20)
GLIADIN IGG: 2 U/mL (ref <20)

## 2014-05-11 LAB — TISSUE TRANSGLUTAMINASE, IGA: TISSUE TRANSGLUTAMINASE AB, IGA: 6.3 U/mL (ref <20)

## 2014-05-12 ENCOUNTER — Encounter: Payer: Self-pay | Admitting: Internal Medicine

## 2014-05-12 LAB — RETICULIN ANTIBODIES, IGA W TITER: Reticulin Ab, IgA: NEGATIVE

## 2014-08-22 ENCOUNTER — Encounter: Payer: Self-pay | Admitting: Internal Medicine

## 2014-08-22 ENCOUNTER — Ambulatory Visit (INDEPENDENT_AMBULATORY_CARE_PROVIDER_SITE_OTHER): Payer: 59 | Admitting: Internal Medicine

## 2014-08-22 VITALS — BP 110/68 | HR 85 | Temp 98.1°F | Wt 179.0 lb

## 2014-08-22 DIAGNOSIS — L739 Follicular disorder, unspecified: Secondary | ICD-10-CM

## 2014-08-22 MED ORDER — SULFAMETHOXAZOLE-TRIMETHOPRIM 800-160 MG PO TABS: 1.0000 | ORAL_TABLET | Freq: Two times a day (BID) | ORAL | Status: DC

## 2014-08-22 NOTE — Progress Notes (Signed)
Pre visit review using our clinic review tool, if applicable. No additional management support is needed unless otherwise documented below in the visit note. 

## 2014-08-22 NOTE — Progress Notes (Signed)
Subjective:    Patient ID: Robin Salas, female    DOB: 10/02/1990, 23 y.o.   MRN: [id number]  HPI  Pt presents to the clinic today with c/o a rash in her vaginal area. She reports that she shaved the area 3 days ago. She has noticed about 3-4 bumps that are swollen and have pus in them. She also reports that she started her menstrual cycle while she was at a friends house. She had to use a type of sanitary napkin that she has had an allergic reaction  to in the past. She also reports that her boyfriend helped her pop the bumps in her vaginal areas. This makes her nervous because her boyfriend recently had a cyst on his hand that was MRSA positive. She has not tried anything OTC.  Review of Systems      Past Medical History  Diagnosis Date  . Hyperhydrosis disorder   . Anemia     Current Outpatient Prescriptions  Medication Sig Dispense Refill  . glycopyrrolate (ROBINUL) 2 MG tablet TAKE 1 TABLET TWICE DAILY 180 tablet 1   No current facility-administered medications for this visit.    Allergies  Allergen Reactions  . Amoxicillin Hives  . Penicillins Hives    Family History  Problem Relation Age of Onset  . Prostate cancer Maternal Grandfather   . Bone cancer Maternal Grandfather   . Breast cancer Maternal Aunt     History   Social History  . Marital Status: Single    Spouse Name: N/A    Number of Children: N/A  . Years of Education: N/A   Occupational History  . Not on file.   Social History Main Topics  . Smoking status: Never Smoker   . Smokeless tobacco: Never Used  . Alcohol Use: No  . Drug Use: No  . Sexual Activity: No   Other Topics Concern  . Not on file   Social History Narrative     Constitutional: Denies fever, malaise, fatigue, headache or abrupt weight changes.  Respiratory: Denies difficulty breathing, shortness of breath, cough or sputum production.   Cardiovascular: Denies chest pain, chest tightness, palpitations or swelling in the  hands or feet.  GU: Pt reports vaginal irritation. Denies urgency, frequency, pain with urination, burning sensation, blood in urine, odor or discharge. Skin: Pt reports bumps in the vaginal area. Denies rashes or ulcercations.    No other specific complaints in a complete review of systems (except as listed in HPI above).  Objective:   Physical Exam   BP 110/68 mmHg  Pulse 85  Temp(Src) 98.1 F (36.7 C) (Oral)  Wt 179 lb (81.194 kg)  SpO2 99%  LMP 08/17/2014 Wt Readings from Last 3 Encounters:  08/22/14 179 lb (81.194 kg)  05/10/14 167 lb (75.751 kg)  03/02/14 169 lb 8 oz (76.885 kg)    General: Appears her stated age, well developed, well nourished in NAD. Skin: Multiple red, raised bumps noted over the symphysis pubis. One are is draining purulent drainage. Tender to palpation. Cardiovascular: Normal rate and rhythm. S1,S2 noted.  No murmur, rubs or gallops noted.  Pulmonary/Chest: Normal effort and positive vesicular breath sounds. No respiratory distress. No wheezes, rales or ronchi noted.   BMET    Component Value Date/Time   NA 141 12/02/2013 1154   K 4.5 12/02/2013 1154   CL 108 12/02/2013 1154   CO2 25 12/02/2013 1154   GLUCOSE 80 12/02/2013 1154   BUN 9 12/02/2013 1154  CREATININE 0.8 12/02/2013 1154   CALCIUM 10.0 12/02/2013 1154    Lipid Panel     Component Value Date/Time   CHOL 181 11/19/2012 0815   TRIG 116.0 11/19/2012 0815   HDL 40.40 11/19/2012 0815   CHOLHDL 4 11/19/2012 0815   VLDL 23.2 11/19/2012 0815   LDLCALC 117* 11/19/2012 0815    CBC    Component Value Date/Time   WBC 6.4 05/10/2014 1108   RBC 4.64 05/10/2014 1108   HGB 10.8* 05/10/2014 1108   HCT 33.1* 05/10/2014 1108   PLT 219.0 05/10/2014 1108   MCV 71.3* 05/10/2014 1108   MCHC 32.6 05/10/2014 1108   RDW 16.2* 05/10/2014 1108   LYMPHSABS 1.6 12/02/2013 1154   MONOABS 0.3 12/02/2013 1154   EOSABS 0.1 12/02/2013 1154   BASOSABS 0.0 12/02/2013 1154    Hgb A1C Lab  Results  Component Value Date   HGBA1C 5.7 05/10/2014        Assessment & Plan:   Folliculitis:  Will send culture from the one that is draining because she is very concerned about MRSA eRx for Septra BID x 7 days Warm compresses to area TID Ibuprofen for pain and inflammation Avoid shaving for a while- when you do, use plenty of lubrication and a brand new razor  RTC as needed or symptoms persist or worsen

## 2014-08-22 NOTE — Patient Instructions (Signed)

## 2014-08-22 NOTE — Addendum Note (Signed)
Addended by: Roena MaladyEVONTENNO, Annice Jolly Y on: 08/22/2014 02:53 PM   Modules accepted: Orders

## 2014-08-25 LAB — WOUND CULTURE
GRAM STAIN: NONE SEEN
Gram Stain: NONE SEEN
Gram Stain: NONE SEEN

## 2014-10-26 ENCOUNTER — Ambulatory Visit (INDEPENDENT_AMBULATORY_CARE_PROVIDER_SITE_OTHER): Payer: 59 | Admitting: Family Medicine

## 2014-10-26 ENCOUNTER — Encounter: Payer: Self-pay | Admitting: Family Medicine

## 2014-10-26 VITALS — BP 118/70 | HR 86 | Temp 98.6°F | Ht 62.0 in | Wt 181.8 lb

## 2014-10-26 DIAGNOSIS — Z0289 Encounter for other administrative examinations: Secondary | ICD-10-CM

## 2014-10-26 NOTE — Progress Notes (Signed)
   Subjective:    Patient ID: Robin Salas, female    DOB: 01/05/1991, 24 y.o.   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   She has been doing well. Nausea, constipation and RUQ pain  has resolved.  She is eating better now, more regularly.  She is getting regular exercise daily. Jogging with dog 1-2 miles. No joint problems, no injuries.  She has already had TB test  Review of Systems  Constitutional: Negative for fever, fatigue and unexpected weight change.  HENT: Negative for congestion, ear pain, sinus pressure, sneezing, sore throat and trouble swallowing.   Eyes: Negative for pain and itching.  Respiratory: Negative for cough, shortness of breath and wheezing.   Cardiovascular: Negative for chest pain, palpitations and leg swelling.  Gastrointestinal: Negative for nausea, abdominal pain, diarrhea, constipation and blood in stool.  Genitourinary: Negative for dysuria, hematuria, vaginal discharge, difficulty urinating and menstrual problem.  Skin: Negative for rash.  Neurological: Negative for syncope, weakness, light-headedness, numbness and headaches.  Psychiatric/Behavioral: Negative for confusion and dysphoric mood. The patient is not nervous/anxious.        Objective:   Physical Exam  Constitutional: Vital signs are normal. She appears well-developed and well-nourished. She is cooperative.  Non-toxic appearance. She does not appear ill. No distress.  HENT:  Head: Normocephalic.  Right Ear: Hearing, tympanic membrane, external ear and ear canal normal. Tympanic membrane is not erythematous, not retracted and not bulging.  Left Ear: Hearing, tympanic membrane, external ear and ear canal normal. Tympanic membrane is not erythematous, not retracted and not bulging.  Nose: No mucosal edema or rhinorrhea. Right sinus exhibits no maxillary sinus tenderness and no frontal sinus tenderness. Left sinus  exhibits no maxillary sinus tenderness and no frontal sinus tenderness.  Mouth/Throat: Uvula is midline, oropharynx is clear and moist and mucous membranes are normal.  Eyes: Conjunctivae, EOM and lids are normal. Pupils are equal, round, and reactive to light. Lids are everted and swept, no foreign bodies found.  Neck: Trachea normal and normal range of motion. Neck supple. Carotid bruit is not present. No thyroid mass and no thyromegaly present.  Cardiovascular: Normal rate, regular rhythm, S1 normal, S2 normal, normal heart sounds, intact distal pulses and normal pulses.  Exam reveals no gallop and no friction rub.   No murmur heard. Pulmonary/Chest: Effort normal and breath sounds normal. No tachypnea. No respiratory distress. She has no decreased breath sounds. She has no wheezes. She has no rhonchi. She has no rales.  Abdominal: Soft. Normal appearance and bowel sounds are normal. There is no tenderness.  Neurological: She is alert.  Skin: Skin is warm, dry and intact. No rash noted.  Psychiatric: Her speech is normal and behavior is normal. Judgment and thought content normal. Her mood appears not anxious. Cognition and memory are normal. She does not exhibit a depressed mood.  MSK: no ttp in spine, full ROM, no upper or lower ext ttp, full ROM in all joints        Assessment & Plan:   She is doing well voerall. She has no current MSk issues. She has no evidence of depression and anxiety.  She will have her TB test done elsewhere. 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

## 2014-10-26 NOTE — Progress Notes (Signed)
Pre visit review using our clinic review tool, if applicable. No additional management support is needed unless otherwise documented below in the visit note. 

## 2015-01-11 ENCOUNTER — Ambulatory Visit (INDEPENDENT_AMBULATORY_CARE_PROVIDER_SITE_OTHER): Payer: 59 | Admitting: Internal Medicine

## 2015-01-11 ENCOUNTER — Encounter (INDEPENDENT_AMBULATORY_CARE_PROVIDER_SITE_OTHER): Payer: Self-pay

## 2015-01-11 ENCOUNTER — Encounter: Payer: Self-pay | Admitting: Internal Medicine

## 2015-01-11 VITALS — BP 116/78 | HR 101 | Temp 98.4°F | Wt 172.0 lb

## 2015-01-11 DIAGNOSIS — J309 Allergic rhinitis, unspecified: Secondary | ICD-10-CM

## 2015-01-11 MED ORDER — METHYLPREDNISOLONE ACETATE 80 MG/ML IJ SUSP
80.0000 mg | Freq: Once | INTRAMUSCULAR | Status: AC
Start: 2015-01-11 — End: 2015-01-11
  Administered 2015-01-11: 80 mg via INTRAMUSCULAR

## 2015-01-11 NOTE — Progress Notes (Signed)
HPI  Pt presents to the clinic today with c/o facial pain and pressure, nasal congestion and cough. She reports this started 2 day ago. The cough is non productive. She is blowing yellow mucous out of her nose. She denies fever, chills or body aches. She has tried Flonase and salt water gargles without relief. She has a history of seasonal allergies or breathing problems. She has had sick contacts.  Review of Systems    Past Medical History  Diagnosis Date  . Hyperhydrosis disorder   . Anemia     Family History  Problem Relation Age of Onset  . Prostate cancer Maternal Grandfather   . Bone cancer Maternal Grandfather   . Breast cancer Maternal Aunt     History   Social History  . Marital Status: Single    Spouse Name: N/A  . Number of Children: N/A  . Years of Education: N/A   Occupational History  . Not on file.   Social History Main Topics  . Smoking status: Never Smoker   . Smokeless tobacco: Never Used  . Alcohol Use: No  . Drug Use: No  . Sexual Activity: No   Other Topics Concern  . Not on file   Social History Narrative    Allergies  Allergen Reactions  . Amoxicillin Hives  . Penicillins Hives     Constitutional: Positive headache. Denies fatigue, fever or abrupt weight changes.  HEENT:  Positive facial pain, nasal congestion. Denies eye redness, ear pain, ringing in the ears, wax buildup, runny nose or sore throat. Respiratory: Positive cough. Denies difficulty breathing or shortness of breath.  Cardiovascular: Denies chest pain, chest tightness, palpitations or swelling in the hands or feet.   No other specific complaints in a complete review of systems (except as listed in HPI above).  Objective:  BP 116/78 mmHg  Pulse 101  Temp(Src) 98.4 F (36.9 C) (Oral)  Wt 172 lb (78.019 kg)  SpO2 99%  LMP 12/20/2014   General: Appears her stated age, well developed, well nourished in NAD. HEENT: Head: normal shape and size,  No sinus tenderness  noted; Eyes: sclera white, no icterus, conjunctiva pink; Ears: Tm's gray and intact, normal light reflex; Nose: mucosa boggy and moist, septum midline; Throat/Mouth: + PND. Teeth present, mucosa erythematous and moist, no exudate noted, no lesions or ulcerations noted.  Neck: No adenopathy noted.  Cardiovascular: Tachycardic with normal rhythm. S1,S2 noted.  No murmur, rubs or gallops noted.  Pulmonary/Chest: Normal effort and positive vesicular breath sounds. No respiratory distress. No wheezes, rales or ronchi noted.      Assessment & Plan:   Allergic Rhintis  Can use a Neti Pot which can be purchased from your local drug store. Switch Flonase to Nasonex Start Zyrtec OTC 80 mg Depo IM today  RTC as needed or if symptoms persist.

## 2015-01-11 NOTE — Patient Instructions (Signed)

## 2015-01-11 NOTE — Addendum Note (Signed)
Addended by: Roena MaladyEVONTENNO, Holmes [place] Y on: 01/11/2015 12:02 PM   Modules accepted: Orders

## 2015-01-11 NOTE — Progress Notes (Signed)
Pre visit review using our clinic review tool, if applicable. No additional management support is needed unless otherwise documented below in the visit note. 

## 2015-02-15 ENCOUNTER — Other Ambulatory Visit: Payer: Self-pay | Admitting: Family Medicine

## 2015-02-15 NOTE — Telephone Encounter (Signed)
Last office visit 01/11/2015 with Nicki Reaperegina Baity. Ok to refill?

## 2015-03-01 ENCOUNTER — Encounter: Payer: Self-pay | Admitting: Family Medicine

## 2015-03-01 ENCOUNTER — Ambulatory Visit (INDEPENDENT_AMBULATORY_CARE_PROVIDER_SITE_OTHER): Payer: 59 | Admitting: Family Medicine

## 2015-03-01 VITALS — BP 118/80 | HR 92 | Temp 98.3°F | Ht 62.0 in | Wt 168.5 lb

## 2015-03-01 DIAGNOSIS — H66002 Acute suppurative otitis media without spontaneous rupture of ear drum, left ear: Secondary | ICD-10-CM | POA: Insufficient documentation

## 2015-03-01 DIAGNOSIS — J309 Allergic rhinitis, unspecified: Secondary | ICD-10-CM | POA: Insufficient documentation

## 2015-03-01 DIAGNOSIS — J301 Allergic rhinitis due to pollen: Secondary | ICD-10-CM

## 2015-03-01 HISTORY — DX: Allergic rhinitis, unspecified: J30.9

## 2015-03-01 MED ORDER — MONTELUKAST SODIUM 10 MG PO TABS: 10.0000 mg | ORAL_TABLET | Freq: Every day | ORAL | Status: DC

## 2015-03-01 MED ORDER — CEFDINIR 300 MG PO CAPS: 300.0000 mg | ORAL_CAPSULE | Freq: Two times a day (BID) | ORAL | Status: DC

## 2015-03-01 NOTE — Assessment & Plan Note (Signed)
Treat with antibiotics. Cefdinir OK choice given amox allergy simple rash.

## 2015-03-01 NOTE — Progress Notes (Signed)
Pre visit review using our clinic review tool, if applicable. No additional management support is needed unless otherwise documented below in the visit note. 

## 2015-03-01 NOTE — Assessment & Plan Note (Signed)
Add singulair to antihistamine regimen. Continue nasal steroid ans saline irrigation.

## 2015-03-01 NOTE — Progress Notes (Signed)
   Subjective:    Patient ID: Robin DrownAmanda L Apple, female    DOB: 10/28/1990, 24 y.o.   MRN: [id number]  Cough This is a new problem. The current episode started in the past 7 days (She has been sttruggling with allergies for 2 montths, now in last few days symptoms worsening). The problem has been gradually worsening. The cough is productive of sputum. Associated symptoms include chest pain, ear congestion, nasal congestion, postnasal drip and shortness of breath. Pertinent negatives include no chills, ear pain, fever, myalgias, sore throat or wheezing. Associated symptoms comments: Pain in left lower jaw pain, painful to open jaw, chewing tenderness, sinus pressure  no ear pain, feels full some . The symptoms are aggravated by lying down (keeping her up at night). Risk factors: nonsmoker. Treatments tried: allergra, nasocort  The treatment provided mild relief. Her past medical history is significant for environmental allergies. There is no history of asthma, bronchiectasis, bronchitis, COPD, emphysema or pneumonia.   Steroid injection for allergies at last OV helped.   Review of Systems  Constitutional: Negative for fever and chills.  HENT: Positive for postnasal drip. Negative for ear pain and sore throat.   Respiratory: Positive for cough and shortness of breath. Negative for wheezing.   Cardiovascular: Positive for chest pain.  Musculoskeletal: Negative for myalgias.  Allergic/Immunologic: Positive for environmental allergies.       Objective:   Physical Exam  Constitutional: Vital signs are normal. She appears well-developed and well-nourished. She is cooperative.  Non-toxic appearance. She does not appear ill. No distress.  HENT:  Head: Normocephalic.  Right Ear: Hearing, tympanic membrane, external ear and ear canal normal. Tympanic membrane is not erythematous, not retracted and not bulging.  Left Ear: Hearing, external ear and ear canal normal. Tympanic membrane is injected,  erythematous and bulging. Tympanic membrane is not retracted. A middle ear effusion is present.  Nose: Mucosal edema and rhinorrhea present. Right sinus exhibits no maxillary sinus tenderness and no frontal sinus tenderness. Left sinus exhibits no maxillary sinus tenderness and no frontal sinus tenderness.  Mouth/Throat: Uvula is midline, oropharynx is clear and moist and mucous membranes are normal. No oropharyngeal exudate, posterior oropharyngeal edema or posterior oropharyngeal erythema.  Eyes: Conjunctivae, EOM and lids are normal. Pupils are equal, round, and reactive to light. Lids are everted and swept, no foreign bodies found.  Neck: Trachea normal and normal range of motion. Neck supple. Carotid bruit is not present. No thyroid mass and no thyromegaly present.  Cardiovascular: Normal rate, regular rhythm, S1 normal, S2 normal, normal heart sounds, intact distal pulses and normal pulses.  Exam reveals no gallop and no friction rub.   No murmur heard. Pulmonary/Chest: Effort normal and breath sounds normal. No tachypnea. No respiratory distress. She has no decreased breath sounds. She has no wheezes. She has no rhonchi. She has no rales.  Neurological: She is alert.  Skin: Skin is warm, dry and intact. No rash noted.  Psychiatric: Her speech is normal and behavior is normal. Judgment normal. Her mood appears not anxious. Cognition and memory are normal. She does not exhibit a depressed mood.          Assessment & Plan:

## 2015-03-01 NOTE — Patient Instructions (Signed)
Continue allegra, nasocort. Continue nasal saline irrigation. Start singulair at bedtime. Complete antibiotics.  Call if fever occurs or symptoms not improving in next 5-7 days.

## 2015-04-04 ENCOUNTER — Other Ambulatory Visit: Payer: Self-pay | Admitting: Family Medicine

## 2015-08-28 ENCOUNTER — Ambulatory Visit (INDEPENDENT_AMBULATORY_CARE_PROVIDER_SITE_OTHER): Payer: 59 | Admitting: Family Medicine

## 2015-08-28 ENCOUNTER — Encounter: Payer: Self-pay | Admitting: Family Medicine

## 2015-08-28 VITALS — BP 118/58 | HR 88 | Temp 98.4°F | Ht 62.0 in | Wt 180.5 lb

## 2015-08-28 DIAGNOSIS — Z30018 Encounter for initial prescription of other contraceptives: Secondary | ICD-10-CM

## 2015-08-28 DIAGNOSIS — Z309 Encounter for contraceptive management, unspecified: Secondary | ICD-10-CM | POA: Insufficient documentation

## 2015-08-28 MED ORDER — NORETHINDRONE 0.35 MG PO TABS: 1.0000 | ORAL_TABLET | Freq: Every day | ORAL | Status: DC

## 2015-08-28 NOTE — Progress Notes (Signed)
Pre visit review using our clinic review tool, if applicable. No additional management support is needed unless otherwise documented below in the visit note. 

## 2015-08-28 NOTE — Assessment & Plan Note (Signed)
Reviewed options in detail.  Suggested trial of progesterone only pill to determine if SE to progesterone. If tolerating.. She plans on setting up for norplant with GYN.

## 2015-08-28 NOTE — Patient Instructions (Addendum)
Start progesterone only pill. Take at the same time each day.  If tolerating call if interested in referral for norplant or to start Depo injections.

## 2015-08-28 NOTE — Progress Notes (Signed)
   Subjective:    Patient ID: Mellody DrownAmanda L Apple, female    DOB: 12/14/1990, 24 y.o.   MRN: [id number]  HPI   24 year old female with PCOS presents prior to getting married to discuss birth control options.   In the past she has used [employer] Made her feel nausea, emesis weight loss. She is not ready to get pregnant, maybe in next 2-3 years.  She is not sexually active and has never been. She is a nonsmoker.  Wt Readings from Last 3 Encounters:  08/28/15 180 lb 8 oz (81.874 kg)  03/01/15 168 lb 8 oz (76.431 kg)  01/11/15 172 lb (78.019 kg)   Body mass index is 33.01 kg/(m^2).   Social History /Family History/Past Medical History reviewed and updated if needed.    Review of Systems  Constitutional: Negative for fever and fatigue.  HENT: Negative for ear pain.   Eyes: Negative for pain.  Respiratory: Negative for chest tightness and shortness of breath.   Cardiovascular: Negative for chest pain, palpitations and leg swelling.  Gastrointestinal: Negative for abdominal pain.  Genitourinary: Negative for dysuria.       Objective:   Physical Exam  Constitutional: Vital signs are normal. She appears well-developed and well-nourished. She is cooperative.  Non-toxic appearance. She does not appear ill. No distress.  HENT:  Head: Normocephalic.  Right Ear: Hearing, tympanic membrane, external ear and ear canal normal. Tympanic membrane is not erythematous, not retracted and not bulging.  Left Ear: Hearing, tympanic membrane, external ear and ear canal normal. Tympanic membrane is not erythematous, not retracted and not bulging.  Nose: No mucosal edema or rhinorrhea. Right sinus exhibits no maxillary sinus tenderness and no frontal sinus tenderness. Left sinus exhibits no maxillary sinus tenderness and no frontal sinus tenderness.  Mouth/Throat: Uvula is midline, oropharynx is clear and moist and mucous membranes are normal.  Eyes: Conjunctivae, EOM and lids are normal. Pupils are equal,  round, and reactive to light. Lids are everted and swept, no foreign bodies found.  Neck: Trachea normal and normal range of motion. Neck supple. Carotid bruit is not present. No thyroid mass and no thyromegaly present.  Cardiovascular: Normal rate, regular rhythm, S1 normal, S2 normal, normal heart sounds, intact distal pulses and normal pulses.  Exam reveals no gallop and no friction rub.   No murmur heard. Pulmonary/Chest: Effort normal and breath sounds normal. No tachypnea. No respiratory distress. She has no decreased breath sounds. She has no wheezes. She has no rhonchi. She has no rales.  Abdominal: Soft. Normal appearance and bowel sounds are normal. There is no tenderness.  Neurological: She is alert.  Skin: Skin is warm, dry and intact. No rash noted.  Psychiatric: Her speech is normal and behavior is normal. Judgment and thought content normal. Her mood appears not anxious. Cognition and memory are normal. She does not exhibit a depressed mood.          Assessment & Plan:

## 2015-11-16 ENCOUNTER — Other Ambulatory Visit: Payer: Self-pay | Admitting: Family Medicine

## 2016-10-01 DIAGNOSIS — M542 Cervicalgia: Secondary | ICD-10-CM | POA: Diagnosis not present

## 2016-10-01 DIAGNOSIS — M546 Pain in thoracic spine: Secondary | ICD-10-CM | POA: Diagnosis not present

## 2016-10-01 DIAGNOSIS — M545 Low back pain: Secondary | ICD-10-CM | POA: Diagnosis not present

## 2016-12-25 DIAGNOSIS — R110 Nausea: Secondary | ICD-10-CM | POA: Diagnosis not present

## 2016-12-25 DIAGNOSIS — R1013 Epigastric pain: Secondary | ICD-10-CM | POA: Diagnosis not present

## 2016-12-25 DIAGNOSIS — R197 Diarrhea, unspecified: Secondary | ICD-10-CM | POA: Diagnosis not present

## 2016-12-26 DIAGNOSIS — R1013 Epigastric pain: Secondary | ICD-10-CM | POA: Diagnosis not present

## 2016-12-26 DIAGNOSIS — R197 Diarrhea, unspecified: Secondary | ICD-10-CM | POA: Diagnosis not present

## 2017-05-14 ENCOUNTER — Ambulatory Visit (HOSPITAL_COMMUNITY)
Admission: EM | Admit: 2017-05-14 | Discharge: 2017-05-14 | Disposition: A | Discharge status: Home / Self Care | Payer: 59 | Attending: Family Medicine | Admitting: Family Medicine

## 2017-05-14 ENCOUNTER — Encounter (HOSPITAL_COMMUNITY): Payer: Self-pay | Admitting: Emergency Medicine

## 2017-05-14 DIAGNOSIS — J301 Allergic rhinitis due to pollen: Secondary | ICD-10-CM

## 2017-05-14 DIAGNOSIS — R0982 Postnasal drip: Secondary | ICD-10-CM | POA: Diagnosis not present

## 2017-05-14 MED ORDER — IPRATROPIUM BROMIDE 0.06 % NA SOLN: 2.0000 | Freq: Four times a day (QID) | NASAL | 12 refills | Status: DC

## 2017-05-14 NOTE — Discharge Instructions (Signed)
Sudafed PE 10 mg every 4 to 6 hours as needed for congestion Allegra or Zyrtec daily as needed for drainage and runny nose. For stronger antihistamine may take Chlor-Trimeton 2 to 4 mg every 4 to 6 hours, may cause drowsiness. Saline nasal spray used frequently. Or nasal lavage. Drink plenty of fluids and stay well-hydrated. Flonase or Rhinocort nasal spray daily

## 2017-05-14 NOTE — ED Triage Notes (Signed)
PT reports facial pain, pressure, congestion, drainage, and headache that started Tuesday.

## 2017-05-14 NOTE — ED Provider Notes (Signed)
[facility]    CSN: [id number] Arrival date & time: 05/14/17  1003     History   Chief Complaint Chief Complaint  Patient presents with  . Facial Pain    HPI Robin Salas is a 26 y.o. female.   26 year old female complaining of upper respiratory congestion sinus pressure runny nose, sneezing and PND associated with cough. Denies shortness of breath or smoking. Symptoms began about 2 days ago. She is taking Mucinex.      Past Medical History:  Diagnosis Date  . Anemia   . Hyperhydrosis disorder     Patient Active Problem List   Diagnosis Date Noted  . Contraceptive management 08/28/2015  . Allergic rhinitis 03/01/2015  . PCOS (polycystic ovarian syndrome), likely 11/22/2012  . Hyperhidrosis, focal, primary 01/22/2011    History reviewed. No pertinent surgical history.  OB History    No data available       Home Medications    Prior to Admission medications   Medication Sig Start Date End Date Taking? Authorizing Provider  BIOTIN PO Take 2 each by mouth daily.    [provider]  fexofenadine (ALLEGRA) 180 MG tablet Take 180 mg by mouth daily.    [provider]  glycopyrrolate (ROBINUL) 2 MG tablet TAKE 1 TABLET TWICE DAILY 11/16/15   Bedsole, Amy E, MD  ipratropium (ATROVENT) 0.06 % nasal spray Place 2 sprays into both nostrils 4 (four) times daily. 05/14/17   Hayden Rasmussen, NP  Multiple Vitamin (MULTIVITAMIN) tablet Take 1 tablet by mouth daily.    [provider]  norethindrone (MICRONOR,CAMILA,ERRIN) 0.35 MG tablet Take 1 tablet (0.35 mg total) by mouth daily. 08/28/15   Excell Seltzer, MD    Family History Family History  Problem Relation Age of Onset  . Prostate cancer Maternal Grandfather   . Bone cancer Maternal Grandfather   . Breast cancer Maternal Aunt     Social History Social History  Substance Use Topics  . Smoking status: Never Smoker  . Smokeless tobacco: Never Used  . Alcohol use No      Allergies   Amoxicillin and Penicillins   Review of Systems Review of Systems  Constitutional: Negative for activity change, appetite change, chills, fatigue and fever.  HENT: Positive for congestion, postnasal drip, rhinorrhea and sinus pressure. Negative for facial swelling.   Eyes: Negative.   Respiratory: Positive for cough.   Cardiovascular: Negative.   Musculoskeletal: Negative for neck pain and neck stiffness.  Skin: Negative for pallor and rash.  Neurological: Negative.      Physical Exam Triage Vital Signs ED Triage Vitals  Enc Vitals Group     BP 05/14/17 1031 118/78     Pulse Rate 05/14/17 1031 (!) 106     Resp 05/14/17 1031 16     Temp 05/14/17 1031 98.2 F (36.8 C)     Temp Source 05/14/17 1031 Oral     SpO2 05/14/17 1031 99 %     Weight 05/14/17 1032 160 lb (72.6 kg)     Height 05/14/17 1032 5\' 2"  (1.575 m)     Head Circumference --      Peak Flow --      Pain Score 05/14/17 1032 4     Pain Loc --      Pain Edu? --      Excl. in GC? --    No data found.   Updated Vital Signs BP 118/78   Pulse (!) 106   Temp  98.2 F (36.8 C) (Oral)   Resp 16   Ht 5\' 2"  (1.575 m)   Wt 160 lb (72.6 kg)   SpO2 99%   BMI 29.26 kg/m   Visual Acuity Right Eye Distance:   Left Eye Distance:   Bilateral Distance:    Right Eye Near:   Left Eye Near:    Bilateral Near:     Physical Exam  Constitutional: She is oriented to person, place, and time. She appears well-developed and well-nourished. No distress.  HENT:  Bilateral TMs mildly retracted otherwise normal. Oropharynx with minor erythema and light cobblestoning, clear scant PND. No exudates or swelling.  Frequently sniffling during the exam.  Neck: Normal range of motion. Neck supple.  Cardiovascular: Normal rate, regular rhythm and normal heart sounds.   Pulmonary/Chest: Effort normal and breath sounds normal. No respiratory distress. She has no wheezes. She has no rales.  Musculoskeletal: Normal  range of motion. She exhibits no edema.  Lymphadenopathy:    She has no cervical adenopathy.  Neurological: She is alert and oriented to person, place, and time.  Skin: Skin is warm and dry. No rash noted.  Psychiatric: She has a normal mood and affect.  Nursing note and vitals reviewed.    UC Treatments / Results  Labs (all labs ordered are listed, but only abnormal results are displayed) Labs Reviewed - No data to display  EKG  EKG Interpretation None       Radiology No results found.  Procedures Procedures (including critical care time)  Medications Ordered in UC Medications - No data to display   Initial Impression / Assessment and Plan / UC Course  I have reviewed the triage vital signs and the nursing notes.  Pertinent labs & imaging results that were available during my care of the patient were reviewed by me and considered in my medical decision making (see chart for details).     Sudafed PE 10 mg every 4 to 6 hours as needed for congestion Allegra or Zyrtec daily as needed for drainage and runny nose. For stronger antihistamine may take Chlor-Trimeton 2 to 4 mg every 4 to 6 hours, may cause drowsiness. Saline nasal spray used frequently. Or nasal lavage. Drink plenty of fluids and stay well-hydrated. Flonase or Rhinocort nasal spray daily   Final Clinical Impressions(s) / UC Diagnoses   Final diagnoses:  Seasonal allergic rhinitis due to pollen  PND (post-nasal drip)    New Prescriptions New Prescriptions   IPRATROPIUM (ATROVENT) 0.06 % NASAL SPRAY    Place 2 sprays into both nostrils 4 (four) times daily.     Controlled Substance Prescriptions [place] Controlled Substance Registry consulted? Not Applicable   Hayden RasmussenMabe, Syrina Wake, NP 05/14/17 1049

## 2017-08-06 DIAGNOSIS — Z23 Encounter for immunization: Secondary | ICD-10-CM | POA: Diagnosis not present

## 2017-10-27 DIAGNOSIS — Z719 Counseling, unspecified: Secondary | ICD-10-CM | POA: Diagnosis not present

## 2017-11-24 ENCOUNTER — Encounter: Payer: Self-pay | Admitting: Family Medicine

## 2017-11-24 ENCOUNTER — Ambulatory Visit: Payer: 59 | Admitting: Family Medicine

## 2017-11-24 ENCOUNTER — Other Ambulatory Visit: Payer: Self-pay

## 2017-11-24 VITALS — BP 102/68 | HR 100 | Temp 98.7°F | Ht 61.5 in | Wt 183.5 lb

## 2017-11-24 DIAGNOSIS — R6889 Other general symptoms and signs: Secondary | ICD-10-CM | POA: Insufficient documentation

## 2017-11-24 MED ORDER — GUAIFENESIN-CODEINE 100-10 MG/5ML PO SYRP: 5.0000 mL | ORAL_SOLUTION | Freq: Every evening | ORAL | 0 refills | Status: DC | PRN

## 2017-11-24 NOTE — Progress Notes (Signed)
   Subjective:    Patient ID: Mellody Drownmanda L Apple, female    DOB: 01/08/1991, 27 y.o.   MRN: [id number]  Sore Throat   Associated symptoms include coughing and ear pain. Pertinent negatives include no shortness of breath.  Cough  This is a new problem. The current episode started yesterday. The problem has been rapidly worsening. Associated symptoms include ear pain, a fever, myalgias, nasal congestion, a sore throat and wheezing. Pertinent negatives include no shortness of breath. Associated symptoms comments: Headache, hips are achy  has noted wheezing when lying down   fever this morning.   off and on ear pain. The symptoms are aggravated by lying down. Risk factors: nonsmoker. Treatments tried: sudafed, motrin. The treatment provided mild relief. Her past medical history is significant for environmental allergies. There is no history of asthma or COPD.  Wheezing   Associated symptoms include coughing, ear pain, a fever and a sore throat. Pertinent negatives include no shortness of breath. There is no history of asthma or COPD.   sick exposure:  yes brother with flu likely    Review of Systems  Constitutional: Positive for fever.  HENT: Positive for ear pain and sore throat.   Respiratory: Positive for cough and wheezing. Negative for shortness of breath.   Musculoskeletal: Positive for myalgias.  Allergic/Immunologic: Positive for environmental allergies.       Objective:   Physical Exam  Constitutional: Vital signs are normal. She appears well-developed and well-nourished. She is cooperative.  Non-toxic appearance. She does not appear ill. No distress.  HENT:  Head: Normocephalic.  Right Ear: Hearing, tympanic membrane, external ear and ear canal normal. Tympanic membrane is not erythematous, not retracted and not bulging.  Left Ear: Hearing, tympanic membrane, external ear and ear canal normal. Tympanic membrane is not erythematous, not retracted and not bulging.  Nose: Mucosal  edema and rhinorrhea present. Right sinus exhibits no maxillary sinus tenderness and no frontal sinus tenderness. Left sinus exhibits no maxillary sinus tenderness and no frontal sinus tenderness.  Mouth/Throat: Uvula is midline and mucous membranes are normal. Posterior oropharyngeal erythema present.  Eyes: Conjunctivae, EOM and lids are normal. Pupils are equal, round, and reactive to light. Lids are everted and swept, no foreign bodies found.  Neck: Trachea normal and normal range of motion. Neck supple. Carotid bruit is not present. No thyroid mass and no thyromegaly present.  Cardiovascular: Normal rate, regular rhythm, S1 normal, S2 normal, normal heart sounds, intact distal pulses and normal pulses. Exam reveals no gallop and no friction rub.  No murmur heard. Pulmonary/Chest: Effort normal and breath sounds normal. No tachypnea. No respiratory distress. She has no decreased breath sounds. She has no wheezes. She has no rhonchi. She has no rales.  Neurological: She is alert.  Skin: Skin is warm, dry and intact. No rash noted.  Psychiatric: Her speech is normal and behavior is normal. Judgment normal. Her mood appears not anxious. Cognition and memory are normal. She does not exhibit a depressed mood.          Assessment & Plan:

## 2017-11-24 NOTE — Patient Instructions (Signed)
Rest , fluids.  Call in next 48 hours if you decide you want tamiflu prescription.  Can use mucinex DM during the day, tylenol or ibuprofen (800 mg every 8 hours) for fever and body aches.  can use prescription cough suppressant at night.

## 2017-11-24 NOTE — Assessment & Plan Note (Signed)
Likely flu, no need for test as no clear indication for tamiflu.  Rest, fluids and symptomatic care.

## 2018-01-06 ENCOUNTER — Ambulatory Visit: Payer: Self-pay | Admitting: *Deleted

## 2018-01-06 ENCOUNTER — Encounter: Payer: Self-pay | Admitting: Family Medicine

## 2018-01-06 ENCOUNTER — Ambulatory Visit: Payer: 59 | Admitting: Family Medicine

## 2018-01-06 VITALS — BP 118/68 | HR 85 | Temp 98.7°F | Wt 197.8 lb

## 2018-01-06 DIAGNOSIS — S20461A Insect bite (nonvenomous) of right back wall of thorax, initial encounter: Secondary | ICD-10-CM

## 2018-01-06 DIAGNOSIS — W57XXXA Bitten or stung by nonvenomous insect and other nonvenomous arthropods, initial encounter: Secondary | ICD-10-CM | POA: Diagnosis not present

## 2018-01-06 MED ORDER — MUPIROCIN 2 % EX OINT: 1.0000 "application " | TOPICAL_OINTMENT | Freq: Two times a day (BID) | CUTANEOUS | 1 refills | Status: DC

## 2018-01-06 NOTE — Progress Notes (Signed)
   Subjective:    Patient ID: Robin Salas, female    DOB: 08/25/1991, 27 y.o.   MRN: [id number]  HPI This is a 27 yo female who presents today with tick bite form 01/03/18. Was on her right upper back near her bra strap on  01/03/18, very small, didn't seem engorged. Thinks it attached the same day. No fever, no rash, no myalgias or headaches.   Past Medical History:  Diagnosis Date  . Anemia   . Hyperhydrosis disorder    No past surgical history on file. Family History  Problem Relation Age of Onset  . Prostate cancer Maternal Grandfather   . Bone cancer Maternal Grandfather   . Breast cancer Maternal Aunt    Social History   Tobacco Use  . Smoking status: Never Smoker  . Smokeless tobacco: Never Used  Substance Use Topics  . Alcohol use: No  . Drug use: No      Review of Systems Per HPI    Objective:   Physical Exam  Constitutional: She is oriented to person, place, and time. She appears well-developed and well-nourished.  HENT:  Head: Normocephalic and atraumatic.  Eyes: Conjunctivae are normal.  Cardiovascular: Normal rate.  Pulmonary/Chest: Effort normal.  Neurological: She is alert and oriented to person, place, and time.  Skin: Skin is warm and dry. There is erythema.  Right lateral side with small area erythema, 3 mm central area of excoriation. No drainage. Area under bra strap.  Psychiatric: She has a normal mood and affect. Her behavior is normal. Judgment and thought content normal.  Vitals reviewed.     BP 118/68   Pulse 85   Temp 98.7 F (37.1 C) (Oral)   Wt 197 lb 12 oz (89.7 kg)   SpO2 97%   BMI 36.76 kg/m  Wt Readings from Last 3 Encounters:  01/06/18 197 lb 12 oz (89.7 kg)  11/24/17 183 lb 8 oz (83.2 kg)  05/14/17 160 lb (72.6 kg)       Assessment & Plan:  1. Tick bite of right upper back excluding scapular region, initial encounter - Provided written and verbal information regarding diagnosis and treatment. - localized reaction, RTC  precautions reviewed, encouraged her to keep area clean and dry and avoid clothing rubbing on it as much as possible - mupirocin ointment (BACTROBAN) 2 %; Apply 1 application topically 2 (two) times daily. For 7-10 days.  Dispense: 22 g; Refill: 1   Robin Reeeborah Gessner, FNP-BC  [facility] [place], [facility]  01/08/2018 6:42 AM

## 2018-01-06 NOTE — Telephone Encounter (Signed)
Pt has appt with Harlin Heys Gessner FNP on 01/06/18 at 4pm.

## 2018-01-06 NOTE — Telephone Encounter (Signed)
Pt had tick bite Sunday and now has redness around the site. There is some itching. Pt denies fever, headache, pain, or any other symptoms.  Appointment made per protocol. [facility].  Reason for Disposition . Red ring or bull's-eye rash occurs at tick bite  Answer Assessment - Initial Assessment Questions 1. TYPE of TICK: "Is it a wood tick or a deer tick?" If unsure, ask: "What size was thtick?" "Did it look more like a watermelon seed or a poppy seed?"      Tiny about size the end of a sharpie and it flat 2. LOCATION: "Where is the tick bite located?"      Underneath bra on the right side 3. ONSET: "How long do you think the tick was attached before you removed it?" (Hours or days)      hours 4. TETANUS: "When was the last tetanus booster?"      Not sure 5. PREGNANCY: "Is there any chance you are pregnant?" "When was your last menstrual period?"     No, LMP using an implant  Protocols used: TICK BITE-A-AH

## 2018-01-06 NOTE — Patient Instructions (Signed)
If you develop fever, headache, muscle aches, rash or flu like symptoms please come in to be checked   Tick Bite Information, Adult Ticks are insects that can bite. Most ticks live in shrubs and grassy areas. They climb onto people and animals that go by. Then they bite. Some ticks carry germs that can make you sick. How can I prevent tick bites?  Use an insect repellent that has 20% or higher of the ingredients DEET, picaridin, or IR3535. Put this insect repellent on: ? Bare skin. ? The tops of your boots. ? Your pant legs. ? The ends of your sleeves.  If you use an insect repellent that has the ingredient permethrin, make sure to follow the instructions on the bottle. Treat the following: ? Clothing. ? Supplies. ? Boots. ? Tents.  Wear long sleeves, long pants, and light colors.  Tuck your pant legs into your socks.  Stay in the middle of the trail.  Try not to walk through long grass.  Before going inside your house, check your clothes, hair, and skin for ticks. Make sure to check your head, neck, armpits, waist, groin, and joint areas.  Check for ticks every day.  When you come indoors: ? Wash your clothes right away. ? Shower right away. ? Dry your clothes in a dryer on high heat for 60 minutes or more. What is the right way to remove a tick? Remove a tick from your skin as soon as possible.  To remove a tick that is crawling on your skin: ? Go outdoors and brush the tick off. ? Use tape or a lint roller.  To remove a tick that is biting: ? Wash your hands. ? If you have latex gloves, put them on. ? Use tweezers, curved forceps, or a tick-removal tool to grasp the tick. Grasp the tick as close to your skin and as close to the tick's head as possible. ? Gently pull up until the tick lets go.  Try to keep the tick's head attached to its body.  Do not twist or jerk the tick.  Do not squeeze or crush the tick.  Do not try to remove a tick with heat, alcohol,  petroleum jelly, or fingernail polish. How should I get rid of a tick? Here are some ways to get rid of a tick that is alive:  Place the tick in rubbing alcohol.  Place the tick in a bag or container you can close tightly.  Wrap the tick tightly in tape.  Flush the tick down the toilet.  Contact a doctor if:  You have symptoms of a disease, such as: ? Pain in a muscle, joint, or bone. ? Trouble walking or moving your legs. ? Numbness in your legs. ? Inability to move (paralysis). ? A red rash that makes a circle (bull's-eye rash). ? Redness and swelling where the tick bit you. ? A fever. ? Throwing up (vomiting) over and over. ? Diarrhea. ? Weight loss. ? Tender and swollen lymph glands. ? Shortness of breath. ? Cough. ? Belly pain (abdominal pain). ? Headache. ? Being more tired than normal. ? A change in how alert (conscious) you are. ? Confusion. Get help right away if:  You cannot remove a tick.  A part of a tick breaks off and gets stuck in your skin.  You are feeling worse. Summary  Ticks may carry germs that can make you sick.  To prevent tick bites, wear long sleeves, long pants, and light  colors. Use insect repellent. Follow the instructions on the bottle.  If the tick is biting, do not try to remove it with heat, alcohol, petroleum jelly, or fingernail polish.  Use tweezers, curved forceps, or a tick-removal tool to grasp the tick. Gently pull up until the tick lets go. Do not twist or jerk the tick. Do not squeeze or crush the tick.  If you have symptoms, contact a doctor. This information is not intended to replace advice given to you by your health care provider. Make sure you discuss any questions you have with your health care provider. Document Released: 12/10/2009 Document Revised: 12/26/2016 Document Reviewed: 12/26/2016 Elsevier Interactive Patient Education  2018 [employer].

## 2018-01-08 ENCOUNTER — Encounter: Payer: Self-pay | Admitting: Family Medicine

## 2018-04-27 DIAGNOSIS — Z13220 Encounter for screening for lipoid disorders: Secondary | ICD-10-CM | POA: Diagnosis not present

## 2018-04-27 DIAGNOSIS — Z1329 Encounter for screening for other suspected endocrine disorder: Secondary | ICD-10-CM | POA: Diagnosis not present

## 2018-04-27 DIAGNOSIS — Z131 Encounter for screening for diabetes mellitus: Secondary | ICD-10-CM | POA: Diagnosis not present

## 2018-04-27 DIAGNOSIS — Z130 Encounter for screening for diseases of the blood and blood-forming organs and certain disorders involving the immune mechanism: Secondary | ICD-10-CM | POA: Diagnosis not present

## 2018-04-27 DIAGNOSIS — Z118 Encounter for screening for other infectious and parasitic diseases: Secondary | ICD-10-CM | POA: Diagnosis not present

## 2018-04-27 DIAGNOSIS — Z0000 Encounter for general adult medical examination without abnormal findings: Secondary | ICD-10-CM | POA: Diagnosis not present

## 2018-04-27 DIAGNOSIS — Z01419 Encounter for gynecological examination (general) (routine) without abnormal findings: Secondary | ICD-10-CM | POA: Diagnosis not present

## 2018-10-20 DIAGNOSIS — Z3046 Encounter for surveillance of implantable subdermal contraceptive: Secondary | ICD-10-CM | POA: Diagnosis not present

## 2019-05-18 ENCOUNTER — Ambulatory Visit (INDEPENDENT_AMBULATORY_CARE_PROVIDER_SITE_OTHER)
Admission: RE | Admit: 2019-05-18 | Discharge: 2019-05-18 | Disposition: A | Discharge status: Home / Self Care | Payer: 59 | Source: Ambulatory Visit | Attending: Family Medicine | Admitting: Family Medicine

## 2019-05-18 ENCOUNTER — Encounter: Payer: Self-pay | Admitting: Family Medicine

## 2019-05-18 ENCOUNTER — Ambulatory Visit (INDEPENDENT_AMBULATORY_CARE_PROVIDER_SITE_OTHER): Payer: 59 | Admitting: Family Medicine

## 2019-05-18 ENCOUNTER — Other Ambulatory Visit: Payer: Self-pay

## 2019-05-18 VITALS — BP 122/64 | HR 98 | Temp 97.8°F | Ht 61.5 in | Wt 218.2 lb

## 2019-05-18 DIAGNOSIS — N912 Amenorrhea, unspecified: Secondary | ICD-10-CM | POA: Diagnosis not present

## 2019-05-18 DIAGNOSIS — M25532 Pain in left wrist: Secondary | ICD-10-CM

## 2019-05-18 LAB — POCT URINE PREGNANCY: Preg Test, Ur: NEGATIVE

## 2019-05-18 MED ORDER — MELOXICAM 15 MG PO TABS: 15.0000 mg | ORAL_TABLET | Freq: Every day | ORAL | 0 refills | Status: DC | PRN

## 2019-05-18 NOTE — Progress Notes (Signed)
Subjective:    Patient ID: Robin PrairieAmanda L Salas, female    DOB: 03/24/1991, 28 y.o.   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  Moved a large mirror -and symptoms started after that -yesterday   Last night- bothersome pain  Now she can barely move it - painful  Perhaps a little swollen  She feels a lump over dorsal wrist and it feels very tight proximal to her thumb   No bruising or redness Can flex a little  Very painful to extend  No h/o old injury or trauma   Took ibuprofen last nt 600 mg  Helped a little   Trying to get pregnant  Menses 7/27 Neg urine preg test today (in anticipation of xray)   Xray today: Dg Wrist Complete Left  Result Date: 05/18/2019 CLINICAL DATA:  Pain and swelling after heavy lifting EXAM: LEFT WRIST - COMPLETE 3+ VIEW COMPARISON:  None. FINDINGS: Frontal, oblique, ulnar deviation scaphoid, and lateral views obtained. No acute fracture or dislocation. There is no appreciable joint space narrowing or erosion. On the lateral view, there is a small amount of calcification dorsal to the lunate, likely of arthropathic etiology. IMPRESSION: No fracture or dislocation. No appreciable joint space narrowing. Slight calcification dorsal to the lunate on the lateral view is likely of arthropathic etiology. Electronically Signed   By: Bretta BangWilliam  Woodruff III M.D.   On: 05/18/2019 11:51      Patient Active Problem List   Diagnosis Date Noted  . Left wrist pain 05/18/2019  . Flu-like symptoms 11/24/2017  . Contraceptive management 08/28/2015  . Allergic rhinitis 03/01/2015  . PCOS (polycystic ovarian syndrome), likely 11/22/2012  . Hyperhidrosis, focal, primary 01/22/2011   Past Medical History:  Diagnosis Date  . Anemia   . Hyperhydrosis disorder    History reviewed. No pertinent surgical history. Social History   Tobacco Use  . Smoking status: Never Smoker  . Smokeless tobacco: Never Used  Substance  Use Topics  . Alcohol use: No  . Drug use: No   Family History  Problem Relation Age of Onset  . Prostate cancer Maternal Grandfather   . Bone cancer Maternal Grandfather   . Breast cancer Maternal Aunt    Allergies  Allergen Reactions  . Amoxicillin Hives  . Penicillins Hives   Current Outpatient Medications on File Prior to Visit  Medication Sig Dispense Refill  . Multiple Vitamin (MULTIVITAMIN) tablet Take 1 tablet by mouth daily.     No current facility-administered medications on file prior to visit.     Review of Systems  Constitutional: Negative for activity change, appetite change, fatigue, fever and unexpected weight change.  HENT: Negative for congestion, ear pain, rhinorrhea, sinus pressure and sore throat.   Eyes: Negative for pain, redness and visual disturbance.  Respiratory: Negative for cough, shortness of breath and wheezing.   Cardiovascular: Negative for chest pain and palpitations.  Gastrointestinal: Negative for abdominal pain, blood in stool, constipation and diarrhea.  Endocrine: Negative for polydipsia and polyuria.  Genitourinary: Negative for dysuria, frequency and urgency.  Musculoskeletal: Negative for arthralgias, back pain and myalgias.       L wrist pain and swelling  Skin: Negative for pallor and rash.  Allergic/Immunologic: Negative for environmental allergies.  Neurological: Negative for dizziness, syncope and headaches.  Hematological: Negative for adenopathy. Does not bruise/bleed easily.  Psychiatric/Behavioral: Negative for decreased concentration and dysphoric mood. The patient is not nervous/anxious.  Objective:   Physical Exam Constitutional:      General: She is not in acute distress.    Appearance: Normal appearance. She is obese. She is not ill-appearing.  HENT:     Head: Normocephalic and atraumatic.  Eyes:     General: No scleral icterus.    Conjunctiva/sclera: Conjunctivae normal.     Pupils: Pupils are equal,  round, and reactive to light.  Neck:     Musculoskeletal: Normal range of motion. Muscular tenderness present. No neck rigidity.  Cardiovascular:     Rate and Rhythm: Tachycardia present.  Pulmonary:     Effort: Pulmonary effort is normal. No respiratory distress.  Musculoskeletal:        General: Swelling and tenderness present. No deformity.     Left wrist: She exhibits decreased range of motion, tenderness, bony tenderness and swelling. She exhibits no effusion, no crepitus, no deformity and no laceration.     Comments: Swelling over dorsal wrist  Tender over dorsal wrist and base of first MCP  Pain with almost any movement  Flex 10-20 deg Ext- none w/o pain  Lateral movement 5-10 deg Rotation-note w/o pain (can partially pronate and supinate)  Pos Finkelstein's test   Nl rom of fingers (causes wrist discomfort) No bruising or skin changes     Skin:    General: Skin is warm and dry.     Findings: No erythema or rash.  Neurological:     Mental Status: She is alert.     Sensory: No sensory deficit.     Motor: No weakness.  Psychiatric:        Mood and Affect: Mood normal.           Assessment & Plan:   Problem List Items Addressed This Visit      Other   Left wrist pain    With mild swelling and limited rom after lifting heavy mirror yesterday  Xray now (s/p interp- no fracture) Given wrist splint for compression/ support  Enc ice and elevation meloxicam px  Will recommend f/u with sport med Suspect sprain - will update if no improvement in several days as well inst to watch for redness or warmth       Relevant Orders   DG Wrist Complete Left (Completed)    Other Visit Diagnoses    Amenorrhea    -  Primary   Relevant Orders   POCT urine pregnancy (Completed)

## 2019-05-18 NOTE — Patient Instructions (Addendum)
Take meloxicam over the counter  Use ice on your wrist any time you can (elevation may help) Wear the wrist brace when active to protect it   We will contact you when the xray report comes in  We will make a plan from there

## 2019-05-18 NOTE — Assessment & Plan Note (Signed)
With mild swelling and limited rom after lifting heavy mirror yesterday  Xray now (s/p interp- no fracture) Given wrist splint for compression/ support  Enc ice and elevation meloxicam px  Will recommend f/u with sport med Suspect sprain - will update if no improvement in several days as well inst to watch for redness or warmth

## 2019-06-10 ENCOUNTER — Other Ambulatory Visit: Payer: Self-pay | Admitting: Family Medicine

## 2019-06-10 NOTE — Telephone Encounter (Signed)
Last office visit 05/18/2019 with Dr. Glori Bickers for Amenorrhea.  Last refilled 05/18/2019 for #30 with no refills.  No future appointments.

## 2019-09-13 ENCOUNTER — Ambulatory Visit: Payer: 59 | Attending: Internal Medicine

## 2019-09-13 ENCOUNTER — Other Ambulatory Visit: Payer: Self-pay

## 2019-09-13 DIAGNOSIS — Z20822 Contact with and (suspected) exposure to covid-19: Secondary | ICD-10-CM

## 2019-09-14 LAB — NOVEL CORONAVIRUS, NAA: SARS-CoV-2, NAA: NOT DETECTED

## 2019-09-30 NOTE — L&D Delivery Note (Addendum)
Operative Delivery Note At 8:10 PM a viable female was delivered via Vaginal, Vacuum Investment banker, operational).  Presentation: LOA; Position: Left,, Occiput,, Anterior; Station: +2. Pt pushed for three hours and became exhausted and asked for help with the delivery.   Verbal consent: obtained from patient.  Risks and benefits discussed in detail.  Risks include, but are not limited to the risks of anesthesia, bleeding, infection, damage to maternal tissues, fetal cephalhematoma.  There is also the risk of inability to effect vaginal delivery of the head, or shoulder dystocia that cannot be resolved by established maneuvers, leading to the need for emergency cesarean section.  APGAR: , 9; weight  .   Placenta status: , .   Cord:  with the following complications: .  Cord pH: na  Anesthesia:  Epidural Instruments: hand held vacuum.  Each pull in the green zone.  No pop offs.  Episiotomy: None Lacerations: 2nd degree;Sulcus;Perineal Suture Repair: 2.0 chromic Est. Blood Loss (mL):    Mom to postpartum.  Baby to Couplet care / Skin to Skin.  Clydette Privitera A Consandra Laske 04/06/2020, 8:50 PM

## 2019-10-18 LAB — OB RESULTS CONSOLE GC/CHLAMYDIA
Chlamydia: NEGATIVE
Gonorrhea: NEGATIVE

## 2019-10-18 LAB — OB RESULTS CONSOLE HIV ANTIBODY (ROUTINE TESTING): HIV: NONREACTIVE

## 2019-10-18 LAB — OB RESULTS CONSOLE RUBELLA ANTIBODY, IGM: Rubella: NON-IMMUNE/NOT IMMUNE

## 2019-10-18 LAB — OB RESULTS CONSOLE RPR: RPR: NONREACTIVE

## 2019-10-18 LAB — OB RESULTS CONSOLE HEPATITIS B SURFACE ANTIGEN: Hepatitis B Surface Ag: NEGATIVE

## 2019-10-18 LAB — OB RESULTS CONSOLE ABO/RH: RH Type: POSITIVE

## 2019-10-18 LAB — OB RESULTS CONSOLE ANTIBODY SCREEN: Antibody Screen: NEGATIVE

## 2019-12-01 ENCOUNTER — Other Ambulatory Visit (HOSPITAL_COMMUNITY): Payer: Self-pay | Admitting: Obstetrics and Gynecology

## 2019-12-01 DIAGNOSIS — Z3689 Encounter for other specified antenatal screening: Secondary | ICD-10-CM

## 2019-12-01 DIAGNOSIS — Z3A22 22 weeks gestation of pregnancy: Secondary | ICD-10-CM

## 2019-12-07 ENCOUNTER — Ambulatory Visit (HOSPITAL_COMMUNITY)
Admission: RE | Admit: 2019-12-07 | Discharge: 2019-12-07 | Disposition: A | Discharge status: Home / Self Care | Payer: 59 | Source: Ambulatory Visit | Attending: Obstetrics and Gynecology | Admitting: Obstetrics and Gynecology

## 2019-12-07 ENCOUNTER — Other Ambulatory Visit: Payer: Self-pay

## 2019-12-07 ENCOUNTER — Encounter (HOSPITAL_COMMUNITY): Payer: Self-pay

## 2019-12-07 ENCOUNTER — Other Ambulatory Visit (HOSPITAL_COMMUNITY): Payer: Self-pay | Admitting: Obstetrics and Gynecology

## 2019-12-07 DIAGNOSIS — Z363 Encounter for antenatal screening for malformations: Secondary | ICD-10-CM

## 2019-12-07 DIAGNOSIS — Z3A22 22 weeks gestation of pregnancy: Secondary | ICD-10-CM | POA: Insufficient documentation

## 2019-12-07 DIAGNOSIS — Z3689 Encounter for other specified antenatal screening: Secondary | ICD-10-CM | POA: Diagnosis not present

## 2019-12-07 DIAGNOSIS — O99212 Obesity complicating pregnancy, second trimester: Secondary | ICD-10-CM

## 2019-12-14 ENCOUNTER — Encounter: Payer: 59 | Attending: Obstetrics and Gynecology | Admitting: Registered"

## 2019-12-14 ENCOUNTER — Other Ambulatory Visit: Payer: Self-pay

## 2019-12-14 DIAGNOSIS — O99810 Abnormal glucose complicating pregnancy: Secondary | ICD-10-CM | POA: Diagnosis present

## 2019-12-24 ENCOUNTER — Encounter: Payer: Self-pay | Admitting: Registered"

## 2019-12-24 DIAGNOSIS — O99810 Abnormal glucose complicating pregnancy: Secondary | ICD-10-CM | POA: Insufficient documentation

## 2019-12-24 HISTORY — DX: Abnormal glucose complicating pregnancy: O99.810

## 2019-12-24 NOTE — Progress Notes (Signed)
Patient was seen on 12/14/19 for Gestational Diabetes self-management class at the [facility]. The following learning objectives were met by the patient during this course:   States the definition of Gestational Diabetes  States why dietary management is important in controlling blood glucose  Describes the effects each nutrient has on blood glucose levels  Demonstrates ability to create a balanced meal plan  Demonstrates carbohydrate counting   States when to check blood glucose levels  Demonstrates proper blood glucose monitoring techniques  States the effect of stress and exercise on blood glucose levels  States the importance of limiting caffeine and abstaining from alcohol and smoking  Blood glucose monitor given: none, has meter and using it.  Patient instructed to monitor glucose levels: FBS: 60 - <95; 1 hour: <140; 2 hour: <120  Patient received handouts:  Nutrition Diabetes and Pregnancy, including carb counting list  Patient will be seen for follow-up as needed.

## 2020-02-18 ENCOUNTER — Inpatient Hospital Stay (HOSPITAL_COMMUNITY)
Admission: AD | Admit: 2020-02-18 | Discharge: 2020-02-19 | Disposition: A | Discharge status: Home / Self Care | Payer: 59 | Attending: Obstetrics & Gynecology | Admitting: Obstetrics & Gynecology

## 2020-02-18 ENCOUNTER — Other Ambulatory Visit: Payer: Self-pay

## 2020-02-18 ENCOUNTER — Encounter (HOSPITAL_COMMUNITY): Payer: Self-pay | Admitting: Obstetrics & Gynecology

## 2020-02-18 DIAGNOSIS — O3463 Maternal care for abnormality of vagina, third trimester: Secondary | ICD-10-CM | POA: Insufficient documentation

## 2020-02-18 DIAGNOSIS — Z3A33 33 weeks gestation of pregnancy: Secondary | ICD-10-CM | POA: Diagnosis not present

## 2020-02-18 DIAGNOSIS — N760 Acute vaginitis: Secondary | ICD-10-CM

## 2020-02-18 DIAGNOSIS — B9689 Other specified bacterial agents as the cause of diseases classified elsewhere: Secondary | ICD-10-CM | POA: Insufficient documentation

## 2020-02-18 DIAGNOSIS — Z0371 Encounter for suspected problem with amniotic cavity and membrane ruled out: Secondary | ICD-10-CM | POA: Diagnosis not present

## 2020-02-18 DIAGNOSIS — O23593 Infection of other part of genital tract in pregnancy, third trimester: Secondary | ICD-10-CM | POA: Insufficient documentation

## 2020-02-18 DIAGNOSIS — Z3689 Encounter for other specified antenatal screening: Secondary | ICD-10-CM

## 2020-02-18 DIAGNOSIS — O99891 Other specified diseases and conditions complicating pregnancy: Secondary | ICD-10-CM | POA: Diagnosis not present

## 2020-02-18 NOTE — MAU Provider Note (Signed)
S: Ms. Robin Salas is a 30 y.o. G1P0 at [redacted]w[redacted]d  who presents to MAU today complaining of leaking of fluid since 1600. She states she was at her brother's wedding and stood up and noticed lots of fluid on her seat.  Patient states she has had no more leaking since that time and did not wear a pad.  She  denies vaginal bleeding or recent sexual activity. She also denies contractions. She reports normal fetal movement.    O: BP 139/76   Pulse (!) 112   Temp 98.1 F (36.7 C) (Oral)   Resp 18   LMP 04/25/2019 Comment: preg test neg  GENERAL: Well-developed, well-nourished female in no acute distress.  HEAD: Normocephalic, atraumatic.  CHEST: Normal effort of breathing, regular heart rate ABDOMEN: Soft, nontender, gravid PELVIC: Normal external female genitalia. Vagina is pink and rugated.  Small amt thin gray discharge.  Negative pooling. [employer]. Cervix with normal contour, no lesions and appears closed.  Cervical exam:  Dilation: Closed Exam by:: Gerrit Heck, cnm   Fetal Monitoring: FHT: 135 bpm, Mod Var, -Decels, +Accels Toco: No ctx graphed initially, then irregular after exam  Results for orders placed or performed during the hospital encounter of 02/18/20 (from the past 24 hour(s))  Wet prep, genital     Status: Abnormal   Collection Time: 02/18/20 11:44 PM  Result Value Ref Range   Yeast Wet Prep HPF POC NONE SEEN NONE SEEN   Trich, Wet Prep NONE SEEN NONE SEEN   Clue Cells Wet Prep HPF POC PRESENT (A) NONE SEEN   WBC, Wet Prep HPF POC MODERATE (A) NONE SEEN   Sperm NONE SEEN      A: SIUP at [redacted]w[redacted]d  Membranes intact  Cat I FT  P: Fern Negative Wet prep pending Will await results  Gerrit Heck, CNM 02/18/2020 11:41 PM  Reassessment (12:32 AM) BV  -Wet prep returns significant for clue cells. -Results discussed with patient and questions addressed. -Reviewed how BV affects pregnancy by increasing risk of PTL, PTD, and SGA infant.   -Discussed ways to avoid in future. -Rx for Metrogel 0.75% PV QHS x 5days sent to pharmacy on file.  -Patient without further questions or concerns. -Instructed to keep regularly scheduled appt with [facility]. -Encouraged to call or return to MAU if symptoms worsen or with the onset of new symptoms. -Discharged to home in stable condition. -NST Reactive  Cherre Robins MSN, CNM Advanced Practice Provider, [facility] [employer]

## 2020-02-18 NOTE — MAU Note (Signed)
Pt reports to MAU for ROM, she states she had a big gush earlier and not much since then. Denies VB, +movement.

## 2020-02-19 LAB — WET PREP, GENITAL
Sperm: NONE SEEN
Trich, Wet Prep: NONE SEEN
Yeast Wet Prep HPF POC: NONE SEEN

## 2020-02-19 MED ORDER — METRONIDAZOLE 0.75 % VA GEL: 1.0000 | Freq: Every day | VAGINAL | 0 refills | Status: DC

## 2020-02-19 NOTE — Discharge Instructions (Signed)
Bacterial Vaginosis  Bacterial vaginosis is a vaginal infection that occurs when the normal balance of bacteria in the vagina is disrupted. It results from an overgrowth of certain bacteria. This is the most common vaginal infection among women ages 15-44. Because bacterial vaginosis increases your risk for STIs (sexually transmitted infections), getting treated can help reduce your risk for chlamydia, gonorrhea, herpes, and HIV (human immunodeficiency virus). Treatment is also important for preventing complications in pregnant women, because this condition can cause an early (premature) delivery. What are the causes? This condition is caused by an increase in harmful bacteria that are normally present in small amounts in the vagina. However, the reason that the condition develops is not fully understood. What increases the risk? The following factors may make you more likely to develop this condition:  Having a new sexual partner or multiple sexual partners.  Having unprotected sex.  Douching.  Having an intrauterine device (IUD).  Smoking.  Drug and alcohol abuse.  Taking certain antibiotic medicines.  Being pregnant. You cannot get bacterial vaginosis from toilet seats, bedding, swimming pools, or contact with objects around you. What are the signs or symptoms? Symptoms of this condition include:  Grey or white vaginal discharge. The discharge can also be watery or foamy.  A fish-like odor with discharge, especially after sexual intercourse or during menstruation.  Itching in and around the vagina.  Burning or pain with urination. Some women with bacterial vaginosis have no signs or symptoms. How is this diagnosed? This condition is diagnosed based on:  Your medical history.  A physical exam of the vagina.  Testing a sample of vaginal fluid under a microscope to look for a large amount of bad bacteria or abnormal cells. Your health care provider may use a cotton swab or  a small wooden spatula to collect the sample. How is this treated? This condition is treated with antibiotics. These may be given as a pill, a vaginal cream, or a medicine that is put into the vagina (suppository). If the condition comes back after treatment, a second round of antibiotics may be needed. Follow these instructions at home: Medicines  Take over-the-counter and prescription medicines only as told by your health care provider.  Take or use your antibiotic as told by your health care provider. Do not stop taking or using the antibiotic even if you start to feel better. General instructions  If you have a female sexual partner, tell her that you have a vaginal infection. She should see her health care provider and be treated if she has symptoms. If you have a female sexual partner, he does not need treatment.  During treatment: ? Avoid sexual activity until you finish treatment. ? Do not douche. ? Avoid alcohol as directed by your health care provider. ? Avoid breastfeeding as directed by your health care provider.  Drink enough water and fluids to keep your urine clear or pale yellow.  Keep the area around your vagina and rectum clean. ? Wash the area daily with warm water. ? Wipe yourself from front to back after using the toilet.  Keep all follow-up visits as told by your health care provider. This is important. How is this prevented?  Do not douche.  Wash the outside of your vagina with warm water only.  Use protection when having sex. This includes latex condoms and dental dams.  Limit how many sexual partners you have. To help prevent bacterial vaginosis, it is best to have sex with just one partner (  monogamous).  Make sure you and your sexual partner are tested for STIs.  Wear cotton or cotton-lined underwear.  Avoid wearing tight pants and pantyhose, especially during summer.  Limit the amount of alcohol that you drink.  Do not use any products that contain  nicotine or tobacco, such as cigarettes and e-cigarettes. If you need help quitting, ask your health care provider.  Do not use illegal drugs. Where to find more information  Centers for Disease Control and Prevention: www.cdc.gov/std  American Sexual Health Association (ASHA): www.ashastd.org  U.S. Department of Health and Human Services, Office on Women's Health: www.womenshealth.gov/ or https://www.womenshealth.gov/a-z-topics/bacterial-vaginosis Contact a health care provider if:  Your symptoms do not improve, even after treatment.  You have more discharge or pain when urinating.  You have a fever.  You have pain in your abdomen.  You have pain during sex.  You have vaginal bleeding between periods. Summary  Bacterial vaginosis is a vaginal infection that occurs when the normal balance of bacteria in the vagina is disrupted.  Because bacterial vaginosis increases your risk for STIs (sexually transmitted infections), getting treated can help reduce your risk for chlamydia, gonorrhea, herpes, and HIV (human immunodeficiency virus). Treatment is also important for preventing complications in pregnant women, because the condition can cause an early (premature) delivery.  This condition is treated with antibiotic medicines. These may be given as a pill, a vaginal cream, or a medicine that is put into the vagina (suppository). This information is not intended to replace advice given to you by your health care provider. Make sure you discuss any questions you have with your health care provider. Document Revised: 08/28/2017 Document Reviewed: 05/31/2016 Elsevier Patient Education  2020 Elsevier Inc.  

## 2020-03-10 LAB — OB RESULTS CONSOLE GBS: GBS: NEGATIVE

## 2020-03-27 ENCOUNTER — Other Ambulatory Visit: Payer: Self-pay | Admitting: Obstetrics and Gynecology

## 2020-03-28 ENCOUNTER — Telehealth (HOSPITAL_COMMUNITY): Payer: Self-pay | Admitting: *Deleted

## 2020-03-28 NOTE — Telephone Encounter (Signed)
Preadmission screen  

## 2020-03-29 ENCOUNTER — Telehealth (HOSPITAL_COMMUNITY): Payer: Self-pay | Admitting: *Deleted

## 2020-03-29 NOTE — Telephone Encounter (Signed)
Preadmission screen  

## 2020-03-30 ENCOUNTER — Telehealth (HOSPITAL_COMMUNITY): Payer: Self-pay | Admitting: *Deleted

## 2020-03-30 ENCOUNTER — Encounter (HOSPITAL_COMMUNITY): Payer: Self-pay | Admitting: *Deleted

## 2020-03-30 NOTE — Telephone Encounter (Signed)
Preadmission screen  

## 2020-04-04 ENCOUNTER — Other Ambulatory Visit (HOSPITAL_COMMUNITY)
Admission: RE | Admit: 2020-04-04 | Discharge: 2020-04-04 | Disposition: A | Discharge status: Home / Self Care | Payer: 59 | Source: Ambulatory Visit | Attending: Obstetrics and Gynecology | Admitting: Obstetrics and Gynecology

## 2020-04-04 LAB — SARS CORONAVIRUS 2 (TAT 6-24 HRS): SARS Coronavirus 2: NEGATIVE

## 2020-04-05 ENCOUNTER — Other Ambulatory Visit: Payer: Self-pay | Admitting: Obstetrics & Gynecology

## 2020-04-06 ENCOUNTER — Inpatient Hospital Stay (HOSPITAL_COMMUNITY): Payer: 59 | Admitting: Anesthesiology

## 2020-04-06 ENCOUNTER — Inpatient Hospital Stay (HOSPITAL_COMMUNITY): Payer: 59

## 2020-04-06 ENCOUNTER — Inpatient Hospital Stay (HOSPITAL_COMMUNITY)
Admission: AD | Admit: 2020-04-06 | Discharge: 2020-04-08 | DRG: 806 | Disposition: A | Discharge status: Home / Self Care | Payer: 59 | Attending: Obstetrics and Gynecology | Admitting: Obstetrics and Gynecology

## 2020-04-06 ENCOUNTER — Other Ambulatory Visit: Payer: Self-pay

## 2020-04-06 ENCOUNTER — Encounter (HOSPITAL_COMMUNITY): Payer: Self-pay | Admitting: Obstetrics and Gynecology

## 2020-04-06 DIAGNOSIS — E282 Polycystic ovarian syndrome: Secondary | ICD-10-CM | POA: Diagnosis present

## 2020-04-06 DIAGNOSIS — O9081 Anemia of the puerperium: Secondary | ICD-10-CM | POA: Diagnosis not present

## 2020-04-06 DIAGNOSIS — Z3A40 40 weeks gestation of pregnancy: Secondary | ICD-10-CM

## 2020-04-06 DIAGNOSIS — O99284 Endocrine, nutritional and metabolic diseases complicating childbirth: Secondary | ICD-10-CM | POA: Diagnosis present

## 2020-04-06 DIAGNOSIS — O2243 Hemorrhoids in pregnancy, third trimester: Secondary | ICD-10-CM | POA: Diagnosis present

## 2020-04-06 DIAGNOSIS — O99344 Other mental disorders complicating childbirth: Secondary | ICD-10-CM | POA: Diagnosis present

## 2020-04-06 DIAGNOSIS — Z20822 Contact with and (suspected) exposure to covid-19: Secondary | ICD-10-CM | POA: Diagnosis present

## 2020-04-06 DIAGNOSIS — Z880 Allergy status to penicillin: Secondary | ICD-10-CM

## 2020-04-06 DIAGNOSIS — F419 Anxiety disorder, unspecified: Secondary | ICD-10-CM | POA: Diagnosis present

## 2020-04-06 DIAGNOSIS — O99345 Other mental disorders complicating the puerperium: Secondary | ICD-10-CM | POA: Diagnosis not present

## 2020-04-06 DIAGNOSIS — Z3490 Encounter for supervision of normal pregnancy, unspecified, unspecified trimester: Secondary | ICD-10-CM | POA: Diagnosis present

## 2020-04-06 DIAGNOSIS — O99214 Obesity complicating childbirth: Secondary | ICD-10-CM | POA: Diagnosis present

## 2020-04-06 DIAGNOSIS — F418 Other specified anxiety disorders: Secondary | ICD-10-CM | POA: Diagnosis not present

## 2020-04-06 DIAGNOSIS — O24420 Gestational diabetes mellitus in childbirth, diet controlled: Secondary | ICD-10-CM | POA: Diagnosis present

## 2020-04-06 DIAGNOSIS — O134 Gestational [pregnancy-induced] hypertension without significant proteinuria, complicating childbirth: Secondary | ICD-10-CM | POA: Diagnosis present

## 2020-04-06 DIAGNOSIS — D62 Acute posthemorrhagic anemia: Secondary | ICD-10-CM | POA: Diagnosis not present

## 2020-04-06 HISTORY — DX: Pathological fracture, other site, initial encounter for fracture: M84.48XA

## 2020-04-06 HISTORY — DX: Deforming dorsopathy, unspecified: M43.9

## 2020-04-06 LAB — PROTEIN / CREATININE RATIO, URINE
Creatinine, Urine: 84.75 mg/dL
Protein Creatinine Ratio: 0.22 mg/mg{Cre} — ABNORMAL HIGH (ref 0.00–0.15)
Total Protein, Urine: 19 mg/dL

## 2020-04-06 LAB — COMPREHENSIVE METABOLIC PANEL
ALT: 15 U/L (ref 0–44)
AST: 24 U/L (ref 15–41)
Albumin: 2.6 g/dL — ABNORMAL LOW (ref 3.5–5.0)
Alkaline Phosphatase: 206 U/L — ABNORMAL HIGH (ref 38–126)
Anion gap: 10 (ref 5–15)
BUN: 7 mg/dL (ref 6–20)
CO2: 19 mmol/L — ABNORMAL LOW (ref 22–32)
Calcium: 8.9 mg/dL (ref 8.9–10.3)
Chloride: 109 mmol/L (ref 98–111)
Creatinine, Ser: 0.98 mg/dL (ref 0.44–1.00)
GFR calc Af Amer: >60 mL/min (ref >60)
GFR calc non Af Amer: >60 mL/min (ref >60)
Glucose, Bld: 81 mg/dL (ref 70–99)
Potassium: 4.1 mmol/L (ref 3.5–5.1)
Sodium: 138 mmol/L (ref 135–145)
Total Bilirubin: 0.9 mg/dL (ref 0.3–1.2)
Total Protein: 6.1 g/dL — ABNORMAL LOW (ref 6.5–8.1)

## 2020-04-06 LAB — CBC
HCT: 29.5 % — ABNORMAL LOW (ref 36.0–46.0)
Hemoglobin: 8.9 g/dL — ABNORMAL LOW (ref 12.0–15.0)
MCH: 21 pg — ABNORMAL LOW (ref 26.0–34.0)
MCHC: 30.2 g/dL (ref 30.0–36.0)
MCV: 69.6 fL — ABNORMAL LOW (ref 80.0–100.0)
Platelets: 188 10*3/uL (ref 150–400)
RBC: 4.24 MIL/uL (ref 3.87–5.11)
RDW: 15.5 % (ref 11.5–15.5)
WBC: 10.5 10*3/uL (ref 4.0–10.5)
nRBC: 0 % (ref 0.0–0.2)

## 2020-04-06 LAB — GLUCOSE, CAPILLARY: Glucose-Capillary: 84 mg/dL (ref 70–99)

## 2020-04-06 LAB — TYPE AND SCREEN
ABO/RH(D): A POS
Antibody Screen: NEGATIVE

## 2020-04-06 LAB — ABO/RH: ABO/RH(D): A POS

## 2020-04-06 LAB — RPR: RPR Ser Ql: NONREACTIVE

## 2020-04-06 MED ORDER — SODIUM CHLORIDE 0.9% FLUSH: 3.0000 mL | Freq: Two times a day (BID) | INTRAVENOUS | Status: DC

## 2020-04-06 MED ORDER — MISOPROSTOL 25 MCG QUARTER TABLET: 25.0000 ug | ORAL_TABLET | ORAL | Status: DC | PRN

## 2020-04-06 MED ORDER — TETANUS-DIPHTH-ACELL PERTUSSIS 5-2.5-18.5 LF-MCG/0.5 IM SUSP: 0.5000 mL | Freq: Once | INTRAMUSCULAR | Status: DC

## 2020-04-06 MED ORDER — ACETAMINOPHEN 325 MG PO TABS
650.0000 mg | ORAL_TABLET | ORAL | Status: DC | PRN
  Administered 2020-04-06 – 2020-04-08 (×6): 650 mg via ORAL
  Filled 2020-04-06 (×7): qty 2

## 2020-04-06 MED ORDER — PHENYLEPHRINE 40 MCG/ML (10ML) SYRINGE FOR IV PUSH (FOR BLOOD PRESSURE SUPPORT): 80.0000 ug | PREFILLED_SYRINGE | INTRAVENOUS | Status: DC | PRN

## 2020-04-06 MED ORDER — OXYCODONE-ACETAMINOPHEN 5-325 MG PO TABS: 1.0000 | ORAL_TABLET | ORAL | Status: DC | PRN

## 2020-04-06 MED ORDER — COCONUT OIL OIL: 1.0000 "application " | TOPICAL_OIL | Status: DC | PRN

## 2020-04-06 MED ORDER — FENTANYL CITRATE (PF) 100 MCG/2ML IJ SOLN: 50.0000 ug | INTRAMUSCULAR | Status: DC | PRN

## 2020-04-06 MED ORDER — SODIUM CHLORIDE (PF) 0.9 % IJ SOLN
INTRAMUSCULAR | Status: DC | PRN
  Administered 2020-04-06: 12 mL/h via EPIDURAL

## 2020-04-06 MED ORDER — MEASLES, MUMPS & RUBELLA VAC IJ SOLR: 0.5000 mL | Freq: Once | INTRAMUSCULAR | Status: DC

## 2020-04-06 MED ORDER — PRENATAL MULTIVITAMIN CH
1.0000 | ORAL_TABLET | Freq: Every day | ORAL | Status: DC
  Administered 2020-04-08: 1 via ORAL
  Filled 2020-04-06: qty 1

## 2020-04-06 MED ORDER — LACTATED RINGERS IV SOLN: INTRAVENOUS | Status: DC

## 2020-04-06 MED ORDER — EPHEDRINE 5 MG/ML INJ: 10.0000 mg | INTRAVENOUS | Status: DC | PRN

## 2020-04-06 MED ORDER — SIMETHICONE 80 MG PO CHEW: 80.0000 mg | CHEWABLE_TABLET | ORAL | Status: DC | PRN

## 2020-04-06 MED ORDER — SODIUM CHLORIDE 0.9 % IV SOLN: 250.0000 mL | INTRAVENOUS | Status: DC | PRN

## 2020-04-06 MED ORDER — DIPHENHYDRAMINE HCL 50 MG/ML IJ SOLN: 12.5000 mg | INTRAMUSCULAR | Status: DC | PRN

## 2020-04-06 MED ORDER — DIBUCAINE (PERIANAL) 1 % EX OINT
1.0000 "application " | TOPICAL_OINTMENT | CUTANEOUS | Status: DC | PRN
  Administered 2020-04-07: 1 via RECTAL
  Filled 2020-04-06: qty 28

## 2020-04-06 MED ORDER — ZOLPIDEM TARTRATE 5 MG PO TABS: 5.0000 mg | ORAL_TABLET | Freq: Every evening | ORAL | Status: DC | PRN

## 2020-04-06 MED ORDER — SENNOSIDES-DOCUSATE SODIUM 8.6-50 MG PO TABS
2.0000 | ORAL_TABLET | ORAL | Status: DC
  Administered 2020-04-07 (×2): 2 via ORAL
  Filled 2020-04-06 (×3): qty 2

## 2020-04-06 MED ORDER — OXYTOCIN 10 UNIT/ML IJ SOLN: 10.0000 [IU] | Freq: Once | INTRAMUSCULAR | Status: DC

## 2020-04-06 MED ORDER — WITCH HAZEL-GLYCERIN EX PADS
1.0000 "application " | MEDICATED_PAD | CUTANEOUS | Status: DC | PRN
  Administered 2020-04-07: 1 via TOPICAL

## 2020-04-06 MED ORDER — LACTATED RINGERS IV SOLN
500.0000 mL | Freq: Once | INTRAVENOUS | Status: AC
  Administered 2020-04-06: 500 mL via INTRAVENOUS

## 2020-04-06 MED ORDER — OXYTOCIN-SODIUM CHLORIDE 30-0.9 UT/500ML-% IV SOLN
1.0000 m[IU]/min | INTRAVENOUS | Status: DC
  Administered 2020-04-06: 12 m[IU]/min via INTRAVENOUS
  Administered 2020-04-06: 2 m[IU]/min via INTRAVENOUS
  Administered 2020-04-06: 10 m[IU]/min via INTRAVENOUS
  Filled 2020-04-06: qty 500

## 2020-04-06 MED ORDER — DIPHENHYDRAMINE HCL 25 MG PO CAPS: 25.0000 mg | ORAL_CAPSULE | Freq: Four times a day (QID) | ORAL | Status: DC | PRN

## 2020-04-06 MED ORDER — FENTANYL-BUPIVACAINE-NACL 0.5-0.125-0.9 MG/250ML-% EP SOLN
12.0000 mL/h | EPIDURAL | Status: DC | PRN
  Filled 2020-04-06: qty 250

## 2020-04-06 MED ORDER — OXYCODONE-ACETAMINOPHEN 5-325 MG PO TABS: 2.0000 | ORAL_TABLET | ORAL | Status: DC | PRN

## 2020-04-06 MED ORDER — ONDANSETRON HCL 4 MG/2ML IJ SOLN: 4.0000 mg | INTRAMUSCULAR | Status: DC | PRN

## 2020-04-06 MED ORDER — BENZOCAINE-MENTHOL 20-0.5 % EX AERO
1.0000 "application " | INHALATION_SPRAY | CUTANEOUS | Status: DC | PRN
  Administered 2020-04-06: 1 via TOPICAL
  Filled 2020-04-06: qty 56

## 2020-04-06 MED ORDER — ACETAMINOPHEN 500 MG PO TABS: 1000.0000 mg | ORAL_TABLET | Freq: Four times a day (QID) | ORAL | Status: DC | PRN

## 2020-04-06 MED ORDER — SOD CITRATE-CITRIC ACID 500-334 MG/5ML PO SOLN: 30.0000 mL | ORAL | Status: DC | PRN

## 2020-04-06 MED ORDER — ONDANSETRON HCL 4 MG/2ML IJ SOLN: 4.0000 mg | Freq: Four times a day (QID) | INTRAMUSCULAR | Status: DC | PRN

## 2020-04-06 MED ORDER — ONDANSETRON HCL 4 MG PO TABS: 4.0000 mg | ORAL_TABLET | ORAL | Status: DC | PRN

## 2020-04-06 MED ORDER — PHENYLEPHRINE 40 MCG/ML (10ML) SYRINGE FOR IV PUSH (FOR BLOOD PRESSURE SUPPORT)
80.0000 ug | PREFILLED_SYRINGE | INTRAVENOUS | Status: DC | PRN
  Filled 2020-04-06: qty 10

## 2020-04-06 MED ORDER — TERBUTALINE SULFATE 1 MG/ML IJ SOLN: 0.2500 mg | Freq: Once | INTRAMUSCULAR | Status: DC | PRN

## 2020-04-06 MED ORDER — LIDOCAINE HCL (PF) 1 % IJ SOLN
INTRAMUSCULAR | Status: DC | PRN
  Administered 2020-04-06 (×2): 6 mL via EPIDURAL

## 2020-04-06 MED ORDER — OXYTOCIN-SODIUM CHLORIDE 30-0.9 UT/500ML-% IV SOLN
2.5000 [IU]/h | INTRAVENOUS | Status: DC
  Filled 2020-04-06: qty 500

## 2020-04-06 MED ORDER — OXYTOCIN BOLUS FROM INFUSION
333.0000 mL | Freq: Once | INTRAVENOUS | Status: AC
  Administered 2020-04-06: 333 mL via INTRAVENOUS

## 2020-04-06 MED ORDER — LACTATED RINGERS IV SOLN: 500.0000 mL | INTRAVENOUS | Status: DC | PRN

## 2020-04-06 MED ORDER — SODIUM CHLORIDE 0.9% FLUSH: 3.0000 mL | INTRAVENOUS | Status: DC | PRN

## 2020-04-06 MED ORDER — LIDOCAINE HCL (PF) 1 % IJ SOLN
30.0000 mL | INTRAMUSCULAR | Status: AC | PRN
  Administered 2020-04-06: 30 mL via SUBCUTANEOUS
  Filled 2020-04-06: qty 30

## 2020-04-06 NOTE — Plan of Care (Signed)

## 2020-04-06 NOTE — Progress Notes (Signed)
SONIYA ASHRAF is a 29 y.o. G1P0 at [redacted]w[redacted]d  IOL for GDMA1 Pitocin infusing  Subjective: Mild cramping  Objective: BP 128/85   Pulse 90   Temp 98.2 F (36.8 C) (Oral)   Resp 18   Ht 5\' 2"  (1.575 m)   Wt 103.3 kg   LMP 04/25/2019 Comment: preg test neg   BMI 41.65 kg/m    FHT:  FHR: 135 bpm, variability: moderate,  accelerations:  Present,  decelerations:  Absent UC:   irregular, every 2-4 minutes SVE:   Dilation: 4 Effacement (%): 80 Station: -1 Exam by:: Manaia Samad CNM AROM clear 1043 Labs: Lab Results  Component Value Date   WBC 10.5 04/06/2020   HGB 8.9 (L) 04/06/2020   HCT 29.5 (L) 04/06/2020   MCV 69.6 (L) 04/06/2020   PLT 188 04/06/2020    Assessment / Plan: Induction of labor due to gestational diabetes,  progressing well on pitocin  Labor: Progressing normally Preeclampsia:  no signs or symptoms of toxicity and Labs pending. Borderline BPs. No meds required Fetal Wellbeing:  Category I Pain Control:  Labor support without medications I/D:  n/a Anticipated MOD:  NSVD  Baron Parmelee B Junior Huezo 04/06/2020, 10:58 AM

## 2020-04-06 NOTE — H&P (Signed)
OB ADMISSION/ HISTORY & PHYSICAL:  Admission Date: 04/06/2020 12:08 AM  Admit Diagnosis: Encounter for induction of labor [Z34.90]    Robin Salas is a 29 y.o. female presenting for IOL at 40.0 weeks due to A1GDM. Reports having some mucous discharge over the last 2 days but denies contractions, leaking of fluid, or vaginal bleeding. Endorses + fetal movement. GBS is negative. Pregnancy complicated by A1GDM. Partner at the bedside providing support. Eagerly anticipating the arrival of baby boy, [name].   Prenatal History: G1P0   EDC : 04/06/2020, by Other [facility]  Prenatal care at [facility] since 20 weeks   Prenatal course complicated by: 1. Maternal obesity complicating pregnancy, childbirth and the puerperium, antepartum - BMI, 39.9 at NOB, continue to observe. 2. Gestational diabetes mellitus - Failed early testing at 67 weeks--refer to [facility], check FBS and 2 hour PCs. 3. Polycystic ovary syndrome   Prenatal Labs: ABO, Rh: A (01/19 0000)  Antibody: Negative (01/19 0000) Rubella: Nonimmune (01/19 0000)  RPR: Nonreactive (01/19 0000)  HBsAg: Negative (01/19 0000)  HIV: Non-reactive (01/19 0000)  GBS: Negative/-- (06/12 0000)  1 hr Glucola : 149, diagnosed GDM at 22 weeks Genetic Screening: Negative quad screen Ultrasound: Growth U/S 03/08/2020 SINGLETON PREGNANCY. VERTEX PRESENTATION. FUNDAL PLACENTA. CERVIX NOT SEEN PER PROTOCOL. AMNIOTIC FLUID APPEARS NORMAL AFI=14CM. EFW=2773g, 6 POUNDS 2OZ, %.    Maternal Diabetes: Yes:  Diabetes Type:  Diet controlled Genetic Screening: Normal Maternal Ultrasounds/Referrals: Normal Fetal Ultrasounds or other Referrals:  None Maternal Substance Abuse:  No Significant Maternal Medications:  None Significant Maternal Lab Results:  None Other Comments:  None  Medical / Surgical History :  Past medical history:  Past Medical History:  Diagnosis Date  . Anemia   . Gestational diabetes   . Hyperhydrosis disorder      Past surgical history:   Past Surgical History:  Procedure Laterality Date  . WISDOM TOOTH EXTRACTION       Family History:  Family History  Problem Relation Age of Onset  . Prostate cancer Maternal Grandfather   . Bone cancer Maternal Grandfather   . Breast cancer Maternal Aunt      Social History:  reports that she has never smoked. She has never used smokeless tobacco. She reports that she does not drink alcohol and does not use drugs.   Allergies: Amoxicillin and Penicillins   Current Medications at time of admission:  Medications Prior to Admission  Medication Sig Dispense Refill Last Dose  . metroNIDAZOLE (METROGEL VAGINAL) 0.75 % vaginal gel Place 1 Applicatorful vaginally at bedtime. Insert one applicator, at bedtime, for 5 nights. 70 g 0   . Multiple Vitamin (MULTIVITAMIN) tablet Take 1 tablet by mouth daily.        Review of Systems: Review of Systems  Constitutional: Negative for chills and fever.  HENT: Negative for congestion and sore throat.   Eyes: Negative for blurred vision and photophobia.  Respiratory: Negative for cough and shortness of breath.   Cardiovascular: Negative for chest pain and leg swelling.  Gastrointestinal: Positive for heartburn. Negative for nausea and vomiting.  Musculoskeletal: Negative for back pain and falls.  Neurological: Negative for dizziness and headaches.  Psychiatric/Behavioral: Negative for depression. The patient is not nervous/anxious.    Physical Exam: Vital signs and nursing notes reviewed.  Patient Vitals for the past 24 hrs:  BP Pulse Weight  04/06/20 0043 (!) 139/97 (!) 101 --  04/06/20 0030 -- -- 103.3 kg    General: AAO x 3, NAD  Heart: RRR Lungs:CTAB Abdomen: Gravid, NT, Leopold's 8.5 lbs Extremities: 1+ non-pitting edema to lower extremities Genitalia / VE: Dilation: 3 Effacement (%): 70 Station: -1 Presentation: Vertex Exam by:: Vanna Shavers CNM   FHR: 125BPM, mod variability, + accels, no decels TOCO: Ctx irregular  Labs:    Pending T&S, CBC, RPR  No results for input(s): WBC, HGB, HCT, PLT in the last 72 hours.  Assessment:  29 y.o. G1P0 at [redacted]w[redacted]d  1. IOL due to A1GDM 2. FHR category 1 3. GBS negative 4. Desires unmedicated delivery 5. Plans to breastfeed 6. Placenta disposal L&D  Plan:  1. Admit to BS 2. Routine L&D orders 3. Analgesia/anesthesia PRN  4. Will start Pitocin 2x2 until adequate contractions 5. Anticipate NSVB  Dr. Richardson Dopp notified of admission/plan of care  June Leap CNM, MSN 04/06/2020, 1:17 AM

## 2020-04-06 NOTE — Anesthesia Preprocedure Evaluation (Signed)
Anesthesia Evaluation  Patient identified by MRN, date of birth, ID band Patient awake    Reviewed: Allergy & Precautions, H&P , NPO status , Patient's Chart, lab work & pertinent test results  Airway Mallampati: II  TM Distance: >3 FB Neck ROM: full    Dental no notable dental hx. (+) Teeth Intact   Pulmonary neg pulmonary ROS,    Pulmonary exam normal breath sounds clear to auscultation       Cardiovascular negative cardio ROS Normal cardiovascular exam Rhythm:regular Rate:Normal     Neuro/Psych negative neurological ROS  negative psych ROS   GI/Hepatic Neg liver ROS, GERD  ,  Endo/Other  diabetesMorbid obesity  Renal/GU negative Renal ROS  negative genitourinary   Musculoskeletal   Abdominal (+) + obese,   Peds  Hematology  (+) Blood dyscrasia, anemia ,   Anesthesia Other Findings   Reproductive/Obstetrics (+) Pregnancy                             Anesthesia Physical Anesthesia Plan  ASA: III  Anesthesia Plan: Epidural   Post-op Pain Management:    Induction:   PONV Risk Score and Plan:   Airway Management Planned:   Additional Equipment:   Intra-op Plan:   Post-operative Plan:   Informed Consent: I have reviewed the patients History and Physical, chart, labs and discussed the procedure including the risks, benefits and alternatives for the proposed anesthesia with the patient or authorized representative who has indicated his/her understanding and acceptance.       Plan Discussed with:   Anesthesia Plan Comments:         Anesthesia Quick Evaluation

## 2020-04-06 NOTE — Progress Notes (Signed)
Robin Salas is a 29 y.o. G1P0 at [redacted]w[redacted]d  IOL for GDMA1 Pitocin infusing Strong urge to push  Subjective: Mild cramping  Objective: BP (!) 150/86   Pulse (!) 116   Temp 99.6 F (37.6 C) (Axillary)   Resp 18   Ht 5\' 2"  (1.575 m)   Wt 103.3 kg   LMP 04/25/2019 Comment: preg test neg   SpO2 98%   BMI 41.65 kg/m    FHT:  FHR: 135 bpm, variability: moderate,  accelerations:  Present,  decelerations:  Absent UC:   irregular, every 2-4 minutes SVE:   Dilation: 10 Effacement (%): 100 Station: Plus 1, Plus 2 Exam by:: katherine g jones Rn  AROM clear 1043 Labs: Lab Results  Component Value Date   WBC 10.5 04/06/2020   HGB 8.9 (L) 04/06/2020   HCT 29.5 (L) 04/06/2020   MCV 69.6 (L) 04/06/2020   PLT 188 04/06/2020    Assessment / Plan: Induction of labor due to gestational diabetes,  progressing well on pitocin  Labor: Progressing normally GHTN:Labs WNL, no meds required Fetal Wellbeing:  Category I Pain Control:  Labor support without medications I/D:  n/a Anticipated MOD:  NSVD Begin pushing  Kionte Baumgardner B Robin Salas 04/06/2020, 7:05 PM

## 2020-04-06 NOTE — Progress Notes (Signed)
Robin Salas is a 29 y.o. G1P0 at [redacted]w[redacted]d  IOL for GDMA1 Pitocin infusing New onset GHTN, no meds required, labs WNL   Subjective: Pt tearful, exhausted, requesting options   Objective: BP (!) 150/86   Pulse (!) 116   Temp 99.6 F (37.6 C) (Axillary)   Resp 18   Ht 5\' 2"  (1.575 m)   Wt 103.3 kg   LMP 04/25/2019 Comment: preg test neg   SpO2 98%   BMI 41.65 kg/m    FHT:  FHR: 135 bpm, variability: moderate,  accelerations:  Present,  decelerations:  Absent UC:   irregular, every 2-4 minutes SVE:   Dilation: 10 Effacement (%): 100 Station: Plus 1, Plus 2 Exam by:: katherine g jones Rn  AROM clear 1043 Labs: Lab Results  Component Value Date   WBC 10.5 04/06/2020   HGB 8.9 (L) 04/06/2020   HCT 29.5 (L) 04/06/2020   MCV 69.6 (L) 04/06/2020   PLT 188 04/06/2020    Assessment / Plan: Induction of labor due to gestational diabetes,  progressing well on pitocin  Labor: Progressing normally GHTN:Labs WNL, no meds required Fetal Wellbeing:  Category I Pain Control:  Labor support without medications I/D:  n/a Anticipated MOD:  NSVD  Dr. 06/07/2020 requested for evaluation of VAVD. In route to the hospital  [facility] B Mete Purdum 04/06/2020, 7:07 PM

## 2020-04-06 NOTE — Anesthesia Procedure Notes (Addendum)
Epidural Patient location during procedure: OB Start time: 04/06/2020 11:55 AM End time: 04/06/2020 12:05 PM  Staffing Anesthesiologist: Leilani Able, MD Performed: anesthesiologist   Preanesthetic Checklist Completed: patient identified, IV checked, site marked, risks and benefits discussed, surgical consent, monitors and equipment checked, pre-op evaluation and timeout performed  Epidural Patient position: sitting Prep: DuraPrep and site prepped and draped Patient monitoring: continuous pulse ox and blood pressure Approach: midline Location: L3-L4 Injection technique: LOR air  Needle:  Needle type: Tuohy  Needle gauge: 17 G Needle length: 9 cm and 9 Needle insertion depth: 9 cm Catheter type: closed end flexible Catheter size: 19 Gauge Catheter at skin depth: 14 cm Test dose: negative and Other  Assessment Events: blood not aspirated, injection not painful, no injection resistance, no paresthesia and negative IV test  Additional Notes Reason for block:procedure for pain

## 2020-04-07 DIAGNOSIS — D62 Acute posthemorrhagic anemia: Secondary | ICD-10-CM | POA: Diagnosis not present

## 2020-04-07 HISTORY — DX: Acute posthemorrhagic anemia: D62

## 2020-04-07 LAB — COMPREHENSIVE METABOLIC PANEL
ALT: 13 U/L (ref 0–44)
AST: 27 U/L (ref 15–41)
Albumin: 2 g/dL — ABNORMAL LOW (ref 3.5–5.0)
Alkaline Phosphatase: 172 U/L — ABNORMAL HIGH (ref 38–126)
Anion gap: 8 (ref 5–15)
BUN: 6 mg/dL (ref 6–20)
CO2: 19 mmol/L — ABNORMAL LOW (ref 22–32)
Calcium: 8.5 mg/dL — ABNORMAL LOW (ref 8.9–10.3)
Chloride: 110 mmol/L (ref 98–111)
Creatinine, Ser: 0.77 mg/dL (ref 0.44–1.00)
GFR calc Af Amer: >60 mL/min (ref >60)
GFR calc non Af Amer: >60 mL/min (ref >60)
Glucose, Bld: 83 mg/dL (ref 70–99)
Potassium: 4 mmol/L (ref 3.5–5.1)
Sodium: 137 mmol/L (ref 135–145)
Total Bilirubin: 0.6 mg/dL (ref 0.3–1.2)
Total Protein: 5 g/dL — ABNORMAL LOW (ref 6.5–8.1)

## 2020-04-07 LAB — CBC
HCT: 24.8 % — ABNORMAL LOW (ref 36.0–46.0)
Hemoglobin: 7.5 g/dL — ABNORMAL LOW (ref 12.0–15.0)
MCH: 20.9 pg — ABNORMAL LOW (ref 26.0–34.0)
MCHC: 30.2 g/dL (ref 30.0–36.0)
MCV: 69.1 fL — ABNORMAL LOW (ref 80.0–100.0)
Platelets: 189 10*3/uL (ref 150–400)
RBC: 3.59 MIL/uL — ABNORMAL LOW (ref 3.87–5.11)
RDW: 15.6 % — ABNORMAL HIGH (ref 11.5–15.5)
WBC: 13.1 10*3/uL — ABNORMAL HIGH (ref 4.0–10.5)
nRBC: 0 % (ref 0.0–0.2)

## 2020-04-07 MED ORDER — MEASLES, MUMPS & RUBELLA VAC IJ SOLR
0.5000 mL | Freq: Once | INTRAMUSCULAR | Status: DC
  Filled 2020-04-07: qty 0.5

## 2020-04-07 NOTE — Progress Notes (Signed)
Rn called [place] [facility] and verified that Dr Normand Sloop does not want to give the patient motrin due to having gestational hypertension.

## 2020-04-07 NOTE — Lactation Note (Signed)
This note was copied from a baby's chart. Lactation Consultation Note  Patient Name: Boy Acelyn Basham CHENI'D Date: 04/07/2020 Reason for consult: Initial assessment;Term;Primapara;1st time breastfeeding  P1 mother whose infant is now 60 hours old.  This is a term baby at 40+0 weeks.  Baby was swaddled and asleep in the bassinet when arrived; mother was trying to rest.  Baby had a circumcision this afternoon.  Mother had many questions regarding newborn care and breast feeding.  Spent considerable amount of time reviewing breast feeding basics with her.  Taught hand expression and she was unable to express colostrum at this time.  Container provided and milk storage times reviewed.  Finger feeding demonstrated.  Discussed cluster feeding and expectations for breast feeding after circumcision.  Mother had a NS from home and questioned whether or not she should be using this.  I suggested she not use the NS and practice latching and feeding with assistance as needed from her RN/LC while in the hospital.  Mother verbalized understanding.  Answered pumping questions, how to handle breast feeding when she returns to work, appropriate time to introduce an artificial nipple and how to allow time for adequate rest.  Father involved and at home now; grandmother present.    Mother will feed 8-12 times/24 hours or sooner if baby shows feeding cues.  Reviewed cues.  She has a DEBP for home use.  Mom made aware of O/P services, breastfeeding support groups, community resources, and our phone # for post-discharge questions.    Maternal Data Formula Feeding for Exclusion: No Has patient been taught Hand Expression?: Yes Does the patient have breastfeeding experience prior to this delivery?: No  Feeding    LATCH Score                   Interventions    Lactation Tools Discussed/Used WIC Program: No Pump Review: Setup, frequency, and cleaning;Milk Storage Initiated by:: Khadar Monger Date initiated:: 04/08/20   Consult Status Consult Status: Follow-up Date: 04/08/20 Follow-up type: In-patient    Dora Sims 04/07/2020, 5:35 PM

## 2020-04-07 NOTE — Anesthesia Postprocedure Evaluation (Signed)
Anesthesia Post Note  Patient: Robin Salas  Procedure(s) Performed: AN AD HOC LABOR EPIDURAL     Patient location during evaluation: Mother Baby Anesthesia Type: Epidural Level of consciousness: awake Pain management: satisfactory to patient Vital Signs Assessment: post-procedure vital signs reviewed and stable Respiratory status: spontaneous breathing Cardiovascular status: stable Anesthetic complications: no   No complications documented.  Last Vitals:  Vitals:   04/06/20 2354 04/07/20 0422  BP: 137/80 132/78  Pulse: (!) 108 93  Resp: 18 18  Temp: 36.7 C 36.9 C  SpO2:      Last Pain:  Vitals:   04/07/20 0422  TempSrc: Oral  PainSc:    Pain Goal:                   [employer]

## 2020-04-07 NOTE — Progress Notes (Signed)
PPD# 1 VAVD w/ 2nd degree laceration Information for the patient's newborn:  Nyomi, Howser [[id number]]  female    Baby Name [name] Circumcision desires in-patient circ   S:   Reports feeling good Tolerating PO fluid and solids No nausea or vomiting Bleeding is light, no clots Pain controlled with PO meds Up ad lib / ambulatory / voiding w/o difficulty Feeding: Breast    O:   VS: BP 132/78   Pulse 93   Temp 98.4 F (36.9 C) (Oral)   Resp 18   Ht 5\' 2"  (1.575 m)   Wt 103.3 kg   LMP 04/25/2019 Comment: preg test neg   SpO2 100%   Breastfeeding Unknown   BMI 41.65 kg/m   LABS:  Recent Labs    04/06/20 0023 04/07/20 0727  WBC 10.5 13.1*  HGB 8.9* 7.5*  PLT 188 189   Blood type: --/--/A POS Performed at [facility], [address] [place], [place]  [phone] (38/46) Rubella: Nonimmune (01/19 0000)                      I&O: Intake/Output      07/09 0701 - 07/10 0700 07/10 0701 - 07/11 0700   Urine (mL/kg/hr) 835 (0.3)    Blood 123    Total Output 958    Net -958           Physical Exam: Alert and oriented X3 Lungs: Clear and unlabored Heart: regular rate and rhythm / no mumurs Abdomen: soft, non-tender, non-distended  Fundus: firm, non-tender Perineum: intact, edematous w/ hemorrhoids Lochia: appropriate Extremities: no edema, no calf pain or tenderness    A:  PPD # 1  Normal exam Hemorrhoids Breast feeding primapara  P:  Routine post partum orders Lactation support Anticipate D/C on 04/08/20    -request an additional day for lactaion support and perineal discomfort after vac-assisted birth   Plan reviewed w/ Dr. 06/09/20, MSN, CNM 04/07/2020, 8:38 AM

## 2020-04-07 NOTE — Plan of Care (Signed)
Problem: Education: Goal: Knowledge of General Education information will improve Description: Including pain rating scale, medication(s)/side effects and non-pharmacologic comfort measures Outcome: Completed/Met   Problem: Clinical Measurements: Goal: Ability to maintain clinical measurements within normal limits will improve Outcome: Completed/Met Goal: Will remain free from infection Outcome: Completed/Met Goal: Diagnostic test results will improve Outcome: Completed/Met Goal: Respiratory complications will improve Outcome: Completed/Met Goal: Cardiovascular complication will be avoided Outcome: Completed/Met   Problem: Activity: Goal: Risk for activity intolerance will decrease Outcome: Completed/Met   Problem: Elimination: Goal: Will not experience complications related to urinary retention Outcome: Completed/Met   Problem: Education: Goal: Knowledge of condition will improve Outcome: Completed/Met   Problem: Activity: Goal: Will verbalize the importance of balancing activity with adequate rest periods Outcome: Completed/Met Goal: Ability to tolerate increased activity will improve Outcome: Completed/Met   Problem: Life Cycle: Goal: Chance of risk for complications during the postpartum period will decrease Outcome: Completed/Met   Problem: Education: Goal: Knowledge of General Education information will improve Description: Including pain rating scale, medication(s)/side effects and non-pharmacologic comfort measures Outcome: Completed/Met   Problem: Clinical Measurements: Goal: Ability to maintain clinical measurements within normal limits will improve Outcome: Completed/Met Goal: Will remain free from infection Outcome: Completed/Met Goal: Diagnostic test results will improve Outcome: Completed/Met Goal: Respiratory complications will improve Outcome: Completed/Met Goal: Cardiovascular complication will be avoided Outcome: Completed/Met   Problem:  Activity: Goal: Risk for activity intolerance will decrease Outcome: Completed/Met   Problem: Elimination: Goal: Will not experience complications related to urinary retention Outcome: Completed/Met   Problem: Education: Goal: Knowledge of condition will improve Outcome: Completed/Met   Problem: Activity: Goal: Will verbalize the importance of balancing activity with adequate rest periods Outcome: Completed/Met Goal: Ability to tolerate increased activity will improve Outcome: Completed/Met   Problem: Life Cycle: Goal: Chance of risk for complications during the postpartum period will decrease Outcome: Completed/Met

## 2020-04-08 DIAGNOSIS — F418 Other specified anxiety disorders: Secondary | ICD-10-CM | POA: Diagnosis not present

## 2020-04-08 DIAGNOSIS — O99345 Other mental disorders complicating the puerperium: Secondary | ICD-10-CM | POA: Diagnosis not present

## 2020-04-08 HISTORY — DX: Other specified anxiety disorders: F41.8

## 2020-04-08 MED ORDER — POLYSACCHARIDE IRON COMPLEX 150 MG PO CAPS
150.0000 mg | ORAL_CAPSULE | Freq: Every day | ORAL | Status: DC
  Administered 2020-04-08: 150 mg via ORAL
  Filled 2020-04-08: qty 1

## 2020-04-08 MED ORDER — LABETALOL HCL 100 MG PO TABS: 100.0000 mg | ORAL_TABLET | Freq: Two times a day (BID) | ORAL | 0 refills | Status: DC

## 2020-04-08 MED ORDER — SERTRALINE HCL 25 MG PO TABS
25.0000 mg | ORAL_TABLET | Freq: Every day | ORAL | 0 refills | Status: DC
Start: 2020-04-08 — End: 2020-08-29

## 2020-04-08 MED ORDER — MAGNESIUM OXIDE 400 (241.3 MG) MG PO TABS
400.0000 mg | ORAL_TABLET | Freq: Every day | ORAL | Status: DC
  Administered 2020-04-08: 400 mg via ORAL
  Filled 2020-04-08: qty 1

## 2020-04-08 MED ORDER — LABETALOL HCL 100 MG PO TABS
100.0000 mg | ORAL_TABLET | Freq: Two times a day (BID) | ORAL | Status: DC
  Administered 2020-04-08: 100 mg via ORAL
  Filled 2020-04-08: qty 1

## 2020-04-08 MED ORDER — IBUPROFEN 800 MG PO TABS
800.0000 mg | ORAL_TABLET | Freq: Three times a day (TID) | ORAL | 0 refills | Status: DC | PRN
Start: 2020-04-08 — End: 2020-08-29

## 2020-04-08 MED ORDER — POLYSACCHARIDE IRON COMPLEX 150 MG PO CAPS: 150.0000 mg | ORAL_CAPSULE | Freq: Every day | ORAL | 1 refills | Status: DC

## 2020-04-08 NOTE — Lactation Note (Signed)
This note was copied from a baby's chart. Lactation Consultation Note  Patient Name: Robin Salas [id number] Date: 04/08/2020 Reason for consult: Follow-up assessment;Hyperbilirubinemia   P1, Baby  36 hours old and on triple phototherapy. Mother temporarily stopped breastfeeding last night due to concern over volume baby was getting and nipple soreness. Suggest mother consider pumping to protect her milk supply. LC will follow up later today to discuss feeding plan with parents.   Maternal Data    Feeding    LATCH Score                   Interventions Interventions: Breast feeding basics reviewed  Lactation Tools Discussed/Used     Consult Status Consult Status: Follow-up Date: 04/08/20 Follow-up type: In-patient    Dahlia Byes [facility] 04/08/2020, 8:41 AM

## 2020-04-08 NOTE — Progress Notes (Signed)
Patient slept for 2-3 hours, she stated she felt refreshed and much better. Patient seemed less anxious and more comfortable. Emotional support provided. Grandmother at bedside helping with care of infant.

## 2020-04-08 NOTE — Discharge Instructions (Signed)
[email or web address]  [email or web address]

## 2020-04-08 NOTE — Discharge Summary (Signed)
VAVD OB Discharge Summary     Patient Name: Robin Salas DOB: January 13, 1991 MRN: [id number]  Date of admission: 04/06/2020 Delivering MD: Jaymes Graff  Date of delivery: 04/06/2020 Type of delivery: VAVD  Newborn Data: Sex: Baby female Circumcision: done in pt already Live born female  Birth Weight: 7 lb 9.7 oz (3450 g) APGAR: 8, 9  Newborn Delivery   Birth date/time: 04/06/2020 20:10:00 Delivery type: Vaginal, Vacuum (Extractor)      Feeding: breast and bottle Infant being discharge to home with mother in stable condition.   Admitting diagnosis: Encounter for induction of labor [Z34.90] Intrauterine pregnancy: [redacted]w[redacted]d     Secondary diagnosis:  Active Problems:   Encounter for induction of labor   Vacuum-assisted vaginal delivery 7/9   Acute blood loss anemia   Normal postpartum course   Postpartum anxiety                                Complications: None                                                              Intrapartum Procedures: spontaneous vaginal delivery Postpartum Procedures: none Complications-Operative and Postpartum: 2nd degree perineal laceration Augmentation: AROM and Pitocin   History of Present Illness: Ms. Robin Salas is a 29 y.o. female, G1P1001, who presents at [redacted]w[redacted]d weeks gestation. The patient has been followed at  [facility]  Her pregnancy has been complicated by:  Patient Active Problem List   Diagnosis Date Noted  . Normal postpartum course 04/08/2020  . Postpartum anxiety 04/08/2020  . Vacuum-assisted vaginal delivery 7/9 04/07/2020  . Acute blood loss anemia 04/07/2020  . Encounter for induction of labor 04/06/2020  . Abnormal glucose tolerance test (GTT) during pregnancy, antepartum 12/24/2019  . Left wrist pain 05/18/2019  . Flu-like symptoms 11/24/2017  . Contraceptive management 08/28/2015  . Allergic rhinitis 03/01/2015  . PCOS (polycystic ovarian syndrome), likely 11/22/2012  .  Hyperhidrosis, focal, primary 01/22/2011    Hospital course:  Induction of Labor With Vaginal Delivery   29 y.o. yo G1P1001 at [redacted]w[redacted]d was admitted to the hospital 04/06/2020 for induction of labor.  Indication for induction: Gestational hypertension and A1 DM.  Patient had an uncomplicated labor course as follows: Membrane Rupture Time/Date: 10:43 AM ,04/06/2020   Delivery Method:Vaginal, Vacuum (Extractor)  Episiotomy: None  Lacerations:  2nd degree;Sulcus;Perineal  Details of delivery can be found in separate delivery note.  Patient had a routine postpartum course. Patient is discharged home 04/08/20.  Newborn Data: Birth date:04/06/2020  Birth time:8:10 PM  Gender:Female  Living status:Living  Apgars:8 ,9  Weight:3450 g  Postpartum Day # 2 : S/P VAVD on 7/9 @ 2010 with 2nd degree laceration after being induced with pitocin then AROM and progressed to fully dilated due to IOL for GHTN and GDMA1. Pt BS been WNL during labor and PP, no meds, will f/u in office for 6 weeks PP for 2 HGTT to check for overt dDM. GHTN, asymptomatic, elevated BP during stay of 130-140s-70-80s, started on labetalol 100mg  BID and is to go home on medication, with f/u in one week for BP check, PCR during admission was 0.22, all other lab unremarkable, BP  currently 133/76 after labetalol given. Patient up ad lib, denies syncope or dizziness. QBL post delivery was , and hgb dropped form 8.9-7.5, on iron , asymptomatic for now.  Reports consuming regular diet without issues and denies N/V. Patient reports 0 bowel movement + passing flatus.  Denies issues with urination and reports bleeding is "lighter."  Patient is breast/bottlefeeding and reports going well.  Desires nexplanon for postpartum contraception.  Pain is being appropriately managed with use of po meds. Baby Female circ was completed in pt. Newborn remains on billi light and pt has decided to supplement. Pt admits to having increased anxiety over breastfeeding and  newborn getting enough milk, therefore has decided to bottle feed for now, pt was encouraged to do what ever was best for her and to decrease anxiety. Pt appears tired and teary eyed, Disscuses personality of increase anxiety, no h/o mental disorder, but admits OCD and anxiety runs in the family, mother on zoloft and tolerates well. Recommended pt start on zoloft, due to increased risk factors for PPD. Pt aware of baby blues versus PDD depression and anxiety. Pt was given PP support groups and psychology today tool to locate therapist. Recommended and script given to start zoloft 25mg  daily for 1 week then in crease to 50mg  for one week then 75mg  todaily for one week to max of 100, then reassess, pt instructed of side effect, and medication safety. Pt denies SI/HI, appears anxiety and teary eyed, but pt endorses "I will be fine". Pt to follow up in one week for mood check as week as BP check.    Physical exam  Vitals:   04/07/20 0422 04/07/20 1323 04/07/20 2337 04/08/20 1008  BP: 132/78 136/74 (!) 141/88 133/76  Pulse: 93 96 95 (!) 104  Resp: 18 18 18 18   Temp: 98.4 F (36.9 C) 98.6 F (37 C)  98.4 F (36.9 C)  TempSrc: Oral Oral  Oral  SpO2:  99%  100%  Weight:      Height:       General: alert, cooperative and no distress Lochia: appropriate Uterine Fundus: firm Perineum: approximate and no hematomas noted.  DVT Evaluation: No evidence of DVT seen on physical exam. Negative Homan's sign. No cords or calf tenderness. No significant calf/ankle edema.  Labs: Lab Results  Component Value Date   WBC 13.1 (H) 04/07/2020   HGB 7.5 (L) 04/07/2020   HCT 24.8 (L) 04/07/2020   MCV 69.1 (L) 04/07/2020   PLT 189 04/07/2020   CMP Latest Ref Rng & Units 04/07/2020  Glucose 70 - 99 mg/dL 83  BUN 6 - 20 mg/dL 6  Creatinine 06/08/2020 - 06/08/2020 mg/dL 06/08/2020  Sodium 06/08/2020 - 8.78 mmol/L 137  Potassium 3.5 - 5.1 mmol/L 4.0  Chloride 98 - 111 mmol/L 110  CO2 22 - 32 mmol/L 19(L)  Calcium 8.9 - 10.3 mg/dL  6.76)  Total Protein 6.5 - 8.1 g/dL 5.0(L)  Total Bilirubin 0.3 - 1.2 mg/dL 0.6  Alkaline Phos 38 - 126 U/L 172(H)  AST 15 - 41 U/L 27  ALT 0 - 44 U/L 13    Date of discharge: 04/08/2020 Discharge Diagnoses: Term Pregnancy-delivered Discharge instruction: per After Visit Summary and "Baby and Me Booklet".  After visit meds:   Activity:           unrestricted and pelvic rest Advance as tolerated. Pelvic rest for 6 weeks.  Diet:                routine  Medications: PNV, Ibuprofen, Colace and Iron Postpartum contraception: Nexplanon Condition:  Pt discharge to home with possible baby in stable, currently newborn under billi  light, pt may room in if newborn discharge not recommended.  PP mood disorder: F/U in 1-2 weeks, start zoloft 25mg  daily, report SI/HI GHTN: F/U in 1 week BP check, continue labetalol 100mg  BID. Report S/SX of preE. GDMA1: F/U in 6 weeks for 2H GTT test to check for overt DM.  Anemia: Iron PO daily  Meds: Allergies as of 04/08/2020      Reactions   Amoxicillin Hives   Penicillins Hives      Medication List    STOP taking these medications   aspirin EC 81 MG tablet   esomeprazole 20 MG packet Commonly known as: NEXIUM   metroNIDAZOLE 0.75 % vaginal gel Commonly known as: METROGEL VAGINAL     TAKE these medications   ibuprofen 800 MG tablet Commonly known as: ADVIL Take 1 tablet (800 mg total) by mouth every 8 (eight) hours as needed.   iron polysaccharides 150 MG capsule Commonly known as: NIFEREX Take 1 capsule (150 mg total) by mouth daily.   labetalol 100 MG tablet Commonly known as: NORMODYNE Take 1 tablet (100 mg total) by mouth 2 (two) times daily.   sertraline 25 MG tablet Commonly known as: Zoloft Take 1 tablet (25 mg total) by mouth daily. Please start by taking 1 pill daily in 7 days may increase to two, every week may increase to 100mg  daily to target symptoms.       Discharge Follow Up:   Follow-up Information    [facility] Follow up in 6 week(s).   Specialty: Obstetrics and Gynecology Why: 1 week f/u for BP and mood check and 6 week for 2HGTT and PPV.  Contact information: [address] [facility] [phone]               Sylvia, NP-C, CNM 04/08/2020, 12:15 PM  [phone], FNP

## 2020-04-10 LAB — SURGICAL PATHOLOGY

## 2020-08-29 ENCOUNTER — Encounter: Payer: Self-pay | Admitting: Family Medicine

## 2020-08-29 ENCOUNTER — Other Ambulatory Visit: Payer: Self-pay | Admitting: Family Medicine

## 2020-08-29 ENCOUNTER — Telehealth (INDEPENDENT_AMBULATORY_CARE_PROVIDER_SITE_OTHER): Payer: 59 | Admitting: Family Medicine

## 2020-08-29 ENCOUNTER — Other Ambulatory Visit: Payer: 59

## 2020-08-29 DIAGNOSIS — R509 Fever, unspecified: Secondary | ICD-10-CM

## 2020-08-29 DIAGNOSIS — R0981 Nasal congestion: Secondary | ICD-10-CM | POA: Diagnosis not present

## 2020-08-29 DIAGNOSIS — J029 Acute pharyngitis, unspecified: Secondary | ICD-10-CM

## 2020-08-29 DIAGNOSIS — R059 Cough, unspecified: Secondary | ICD-10-CM

## 2020-08-29 MED ORDER — DOXYCYCLINE HYCLATE 100 MG PO TABS
100.0000 mg | ORAL_TABLET | Freq: Two times a day (BID) | ORAL | 0 refills | Status: AC
Start: 2020-08-29 — End: 2020-09-08

## 2020-08-29 MED ORDER — FLUCONAZOLE 150 MG PO TABS
ORAL_TABLET | ORAL | 0 refills | Status: DC
Start: 2020-08-29 — End: 2020-11-27

## 2020-08-29 NOTE — Progress Notes (Signed)
Robin Asch T. Hudsyn Barich, MD Primary Care and Sports Medicine [facility] [address] [place], [address] Phone: [phone]  FAX: [phone]  Robin Salas - 29 y.o. female  MRN [id number]  Date of Birth: 01/07/1991  Visit Date: 08/29/2020  PCP: Excell Seltzer, MD  Referred by: Excell Seltzer, MD Chief Complaint  Patient presents with  . Sore Throat  . Nasal Congestion  . Fever   Virtual Visit via Video Note:  I connected with  Robin Salas on 08/29/2020  3:20 PM EST by a video enabled telemedicine application and verified that I am speaking with the correct person using two identifiers.   Location patient: home computer, tablet, or smartphone Location provider: work or home office Consent: Verbal consent directly obtained from [employer]. Persons participating in the virtual visit: patient, provider  I discussed the limitations of evaluation and management by telemedicine and the availability of in person appointments. The patient expressed understanding and agreed to proceed.  History of Present Illness:  She is a very nice young lady, and she presents via video visit today to talk about some ongoing symptoms for about the last week.  Multiple members in her family are sick at home.  She does have quite a bit of congestion and cough.  She also has nasal congestion and rhinorrhea.  She also has significant headache.  She did have a T-max of 99.4 last evening.  Yesterday morning at 5 AM she did have to take some Tylenol.  She does have a 24-month-old child at home and well.  She does have a sore throat additionally.  Some fullness in the ears.  She denies other symptoms including GI symptoms or rashes.  Immunization History  Administered Date(s) Administered  . Influenza Whole 10/21/2007, 08/04/2008  . Influenza,inj,Quad PF,6+ Mos 08/06/2017  . Td 09/29/2004     Review of Systems as above: See pertinent positives and  pertinent negatives per HPI No acute distress verbally   Observations/Objective/Exam:  An attempt was made to discern vital signs over the phone and per patient if applicable and possible.   General:    Alert, Oriented, appears well and in no acute distress  Pulmonary:     On inspection no signs of respiratory distress.  Psych / Neurological:     Pleasant and cooperative.  Assessment and Plan:    ICD-10-CM   1. Fever, unspecified fever cause  R50.9 CANCELED: Novel Coronavirus, NAA ([facility])  2. Cough  R05.9   3. Nasal congestion  R09.81   4. Sore throat  J02.9    This is difficult to assess over a video visit.  I am can patient have testing for coronavirus, influenza, and strep throat.  Presumptively treating with doxycycline for potential sinusitis.  She is penicillin allergic.  I discussed the assessment and treatment plan with the patient. The patient was provided an opportunity to ask questions and all were answered. The patient agreed with the plan and demonstrated an understanding of the instructions.   The patient was advised to call back or seek an in-person evaluation if the symptoms worsen or if the condition fails to improve as anticipated.  Follow-up: prn unless noted otherwise below No follow-ups on file.  Meds ordered this encounter  Medications  . doxycycline (VIBRA-TABS) 100 MG tablet    Sig: Take 1 tablet (100 mg total) by mouth 2 (two) times daily for 10 days.    Dispense:  20 tablet    Refill:  0  . fluconazole (DIFLUCAN) 150 MG tablet    Sig: Take 1 tab po today, repeat in 7 days if needed    Dispense:  2 tablet    Refill:  0   No orders of the defined types were placed in this encounter.   Signed,  Elpidio Galea. Pinchas Reither, MD

## 2020-08-31 LAB — NOVEL CORONAVIRUS, NAA: SARS-CoV-2, NAA: NOT DETECTED

## 2020-08-31 LAB — SARS-COV-2, NAA 2 DAY TAT

## 2020-08-31 LAB — SPECIMEN STATUS REPORT

## 2020-11-27 ENCOUNTER — Ambulatory Visit (INDEPENDENT_AMBULATORY_CARE_PROVIDER_SITE_OTHER): Payer: 59 | Admitting: Family Medicine

## 2020-11-27 ENCOUNTER — Other Ambulatory Visit: Payer: Self-pay

## 2020-11-27 VITALS — BP 134/78 | HR 93 | Temp 96.9°F | Ht 62.0 in | Wt 210.0 lb

## 2020-11-27 DIAGNOSIS — K219 Gastro-esophageal reflux disease without esophagitis: Secondary | ICD-10-CM | POA: Diagnosis not present

## 2020-11-27 DIAGNOSIS — R1011 Right upper quadrant pain: Secondary | ICD-10-CM

## 2020-11-27 DIAGNOSIS — R197 Diarrhea, unspecified: Secondary | ICD-10-CM

## 2020-11-27 HISTORY — DX: Gastro-esophageal reflux disease without esophagitis: K21.9

## 2020-11-27 LAB — CBC WITH DIFFERENTIAL/PLATELET
Basophils Absolute: 0 10*3/uL (ref 0.0–0.1)
Basophils Relative: 0.4 % (ref 0.0–3.0)
Eosinophils Absolute: 0.1 10*3/uL (ref 0.0–0.7)
Eosinophils Relative: 1.6 % (ref 0.0–5.0)
HCT: 33.8 % — ABNORMAL LOW (ref 36.0–46.0)
Hemoglobin: 10.5 g/dL — ABNORMAL LOW (ref 12.0–15.0)
Lymphocytes Relative: 29.3 % (ref 12.0–46.0)
Lymphs Abs: 2.1 10*3/uL (ref 0.7–4.0)
MCHC: 31.1 g/dL (ref 30.0–36.0)
MCV: 63.5 fl — ABNORMAL LOW (ref 78.0–100.0)
Monocytes Absolute: 0.3 10*3/uL (ref 0.1–1.0)
Monocytes Relative: 4.7 % (ref 3.0–12.0)
Neutro Abs: 4.5 10*3/uL (ref 1.4–7.7)
Neutrophils Relative %: 64 % (ref 43.0–77.0)
Platelets: 255 10*3/uL (ref 150.0–400.0)
RBC: 5.32 Mil/uL — ABNORMAL HIGH (ref 3.87–5.11)
RDW: 17.1 % — ABNORMAL HIGH (ref 11.5–15.5)
WBC: 7.1 10*3/uL (ref 4.0–10.5)

## 2020-11-27 LAB — COMPREHENSIVE METABOLIC PANEL
ALT: 20 U/L (ref 0–35)
AST: 21 U/L (ref 0–37)
Albumin: 4.4 g/dL (ref 3.5–5.2)
Alkaline Phosphatase: 82 U/L (ref 39–117)
BUN: 11 mg/dL (ref 6–23)
CO2: 26 mEq/L (ref 19–32)
Calcium: 9.4 mg/dL (ref 8.4–10.5)
Chloride: 106 mEq/L (ref 96–112)
Creatinine, Ser: 0.86 mg/dL (ref 0.40–1.20)
GFR: 92.16 mL/min (ref >60.00)
Glucose, Bld: 91 mg/dL (ref 70–99)
Potassium: 4.1 mEq/L (ref 3.5–5.1)
Sodium: 137 mEq/L (ref 135–145)
Total Bilirubin: 1 mg/dL (ref 0.2–1.2)
Total Protein: 7.6 g/dL (ref 6.0–8.3)

## 2020-11-27 NOTE — Progress Notes (Signed)
Subjective:     Robin Salas is a 30 y.o. female presenting for Abdominal Pain (Under right breast/rib cage X 1 week) and Diarrhea (Off and on x couple weeks )     Abdominal Pain This is a new problem. The current episode started in the past 7 days. The problem occurs constantly. The pain is located in the RUQ. The pain is at a severity of 5/10. The quality of the pain is sharp (trapped gas feeling - pressure). The abdominal pain does not radiate. Associated symptoms include anorexia, constipation, diarrhea (off and on), flatus and nausea. Pertinent negatives include no vomiting. Associated symptoms comments: Heartburn. The pain is aggravated by eating. The pain is relieved by bowel movements. She has tried proton pump inhibitors (nexium prn) for the symptoms. The treatment provided significant relief. hx of h pylori  Diarrhea  Associated symptoms include abdominal pain and increased flatus. Pertinent negatives include no vomiting. hx of h pylori   Diarrhea has been going on for several weeks - and even in pregnancy - does not seem to be food related  Review of Systems  Gastrointestinal: Positive for abdominal pain, anorexia, constipation, diarrhea (off and on), flatus and nausea. Negative for vomiting.     Social History   Tobacco Use  Smoking Status Never Smoker  Smokeless Tobacco Never Used        Objective:    BP Readings from Last 3 Encounters:  11/27/20 134/78  04/08/20 132/80  02/18/20 131/68   Wt Readings from Last 3 Encounters:  11/27/20 210 lb (95.3 kg)  04/06/20 227 lb 11.2 oz (103.3 kg)  05/18/19 218 lb 3 oz (99 kg)    BP 134/78   Pulse 93   Temp (!) 96.9 F (36.1 C) (Temporal)   Ht 5\' 2"  (1.575 m)   Wt 210 lb (95.3 kg)   SpO2 99%   BMI 38.41 kg/m    Physical Exam Constitutional:      General: She is not in acute distress.    Appearance: She is well-developed. She is not diaphoretic.  HENT:     Right Ear: External ear normal.     Left  Ear: External ear normal.     Nose: Nose normal.  Eyes:     Conjunctiva/sclera: Conjunctivae normal.  Cardiovascular:     Rate and Rhythm: Normal rate and regular rhythm.     Heart sounds: No murmur heard.   Pulmonary:     Effort: Pulmonary effort is normal. No respiratory distress.     Breath sounds: Normal breath sounds. No wheezing.  Abdominal:     General: Abdomen is flat. Bowel sounds are normal. There is no distension.     Palpations: Abdomen is soft.     Tenderness: There is abdominal tenderness in the right upper quadrant. There is no guarding or rebound. Negative signs include Murphy's sign.  Musculoskeletal:     Cervical back: Neck supple.  Skin:    General: Skin is warm and dry.     Capillary Refill: Capillary refill takes less than 2 seconds.  Neurological:     Mental Status: She is alert. Mental status is at baseline.  Psychiatric:        Mood and Affect: Mood normal.        Behavior: Behavior normal.           Assessment & Plan:   Problem List Items Addressed This Visit      Digestive   Acid reflux  Encouraged daily nexium for 1-2 weeks to see if that improves symptoms. Though less likely the cause of RUQ abdominal pain        Other   RUQ abdominal pain - Primary    Concerning for gallstone given intermittent and location. Negative murphy's sign today. Will get [place] and labs. If labs abnormal will plan to expedite [place]. Work on dietary changes. ER precautions discussed      Relevant Orders   Comprehensive metabolic panel   CBC with Differential   US ABDOMEN LIMITED RUQ (LIVER/GB)   Diarrhea    She notes intermittent diarrhea but constipation x 1 day. If no gallstone pathology wonder about constipation. Continue to monitor          Return if symptoms worsen or fail to improve.  Lynnda Child, MD  This visit occurred during the SARS-CoV-2 public health emergency.  Safety protocols were in place, including screening questions prior to the  visit, additional usage of staff PPE, and extensive cleaning of exam room while observing appropriate contact time as indicated for disinfecting solutions.

## 2020-11-27 NOTE — Assessment & Plan Note (Signed)
She notes intermittent diarrhea but constipation x 1 day. If no gallstone pathology wonder about constipation. Continue to monitor

## 2020-11-27 NOTE — Assessment & Plan Note (Signed)
Concerning for gallstone given intermittent and location. Negative murphy's sign today. Will get [place] and labs. If labs abnormal will plan to expedite [place]. Work on dietary changes. ER precautions discussed

## 2020-11-27 NOTE — Assessment & Plan Note (Signed)
Encouraged daily nexium for 1-2 weeks to see if that improves symptoms. Though less likely the cause of RUQ abdominal pain

## 2020-11-27 NOTE — Patient Instructions (Signed)
Cholelithiasis  Cholelithiasis is a disease in which gallstones form in the gallbladder. The gallbladder is an organ that stores bile. Bile is a fluid that helps to digest fats. Gallstones begin as small crystals and can slowly grow into stones. They may cause no symptoms until they block the gallbladder duct, or cystic duct, when the gallbladder tightens (contracts) after food is eaten. This can cause pain and is known as a gallbladder attack, or biliary colic. There are two main types of gallstones:  Cholesterol stones. These are the most common type of gallstone. These stones are made of hardened cholesterol and are usually yellow-green in color. Cholesterol is a fat-like substance that is made in the liver.  Pigment stones. These are dark in color and are made of a red-yellow substance, called bilirubin,that forms when hemoglobin from red blood cells breaks down.  Follow these instructions at home: Medicines  Take over-the-counter and prescription medicines only as told by your health care provider.  If you were prescribed an antibiotic medicine, take it as told by your health care provider. Do not stop taking the antibiotic even if you start to feel better.  Ask your health care provider if the medicine prescribed to you requires you to avoid driving or using machinery. Eating and drinking  Drink enough fluid to keep your urine pale yellow. This is important during a gallbladder attack. Water and clear liquids are preferred.  Follow a healthy diet. This includes: ? Reducing fatty foods, such as fried food and foods high in cholesterol. ? Reducing refined carbohydrates, such as white bread and white rice. ? Eating more fiber. Aim for foods such as almonds, fruit, and beans. Alcohol use  If you drink alcohol: ? Limit how much you use to:  0-1 drink a day for nonpregnant women.  0-2 drinks a day for men. ? Be aware of how much alcohol is in your drink. In the [place] one drink equals  one 12 oz bottle of beer (355 mL), one 5 oz glass of wine (148 mL), or one 1 oz glass of hard liquor (44 mL). General instructions  Do not use any products that contain nicotine or tobacco, such as cigarettes, e-cigarettes, and chewing tobacco. If you need help quitting, ask your health care provider.  Maintain a healthy weight.  Keep all follow-up visits as told by your health care provider. These may include consultations with a surgeon or specialist. This is important. Where to find more information  [employer] of Diabetes and Digestive and Kidney Diseases: [employer] Contact a health care provider if:  You think you have had a gallbladder attack.  You have been diagnosed with silent gallstones and you develop pain in your abdomen or indigestion.  You begin to have attacks more often.  You have dark urine or light-colored stools. Get help right away if:  You have pain from a gallbladder attack that lasts for more than 2 hours.  You have pain in your abdomen that lasts for more than 5 hours or is getting worse.  You have a fever or chills.  You have nausea and vomiting that do not go away.  You develop jaundice. Summary  Cholelithiasis is a disease in which gallstones form in the gallbladder.  This condition may be caused by an imbalance in the different parts that make bile. This can happen if your bile has too much bilirubin or cholesterol, or does not have enough bile salts.  Treatment for gallstones depends on the severity of  the condition. Silent gallstones do not need treatment.  If gallstones cause a gallbladder attack or other symptoms, treatment usually involves not eating or drinking anything. Treatment may also include pain medicines and antibiotics, and it sometimes includes a hospital stay.  Surgery to remove the gallbladder is common if all other treatments have not worked. This information is not intended to replace advice given to you by  your health care provider. Make sure you discuss any questions you have with your health care provider. Document Revised: 08/08/2019 Document Reviewed: 08/08/2019 Elsevier Patient Education  2021 [employer].

## 2020-11-29 ENCOUNTER — Ambulatory Visit
Admission: RE | Admit: 2020-11-29 | Discharge: 2020-11-29 | Disposition: A | Discharge status: Home / Self Care | Payer: 59 | Source: Ambulatory Visit | Attending: Family Medicine | Admitting: Family Medicine

## 2020-11-29 ENCOUNTER — Other Ambulatory Visit: Payer: Self-pay

## 2020-11-29 DIAGNOSIS — R1011 Right upper quadrant pain: Secondary | ICD-10-CM | POA: Diagnosis not present

## 2020-11-30 ENCOUNTER — Telehealth: Payer: Self-pay | Admitting: *Deleted

## 2020-11-30 DIAGNOSIS — K8020 Calculus of gallbladder without cholecystitis without obstruction: Secondary | ICD-10-CM

## 2020-11-30 NOTE — Telephone Encounter (Signed)
Patent called stating that she has gotten her lab results but has not gotten the results from the ultrasound that was done. Patient stated that she has a gallstone and wants to know what she should do about that. Patient requested a call back with results and recommendations. Patient is aware that Dr. Selena Batten is out of the office and requested that the message go to her PCP.

## 2020-11-30 NOTE — Telephone Encounter (Signed)
Mychart sent to patient. I can place a surgery referral on Monday when I return to the office  FYI to pcp  Sending to ma to call to make sure pt has seen mychart

## 2020-12-03 NOTE — Telephone Encounter (Signed)
Mychart message sent and pt responded that [place] will be fine.

## 2020-12-03 NOTE — Telephone Encounter (Signed)
Referral placed to [facility]. Please call pt to let her know and ask if she prefers [place] or [place].

## 2020-12-06 ENCOUNTER — Other Ambulatory Visit: Payer: Self-pay

## 2020-12-06 ENCOUNTER — Ambulatory Visit (INDEPENDENT_AMBULATORY_CARE_PROVIDER_SITE_OTHER): Payer: 59 | Admitting: Surgery

## 2020-12-06 ENCOUNTER — Encounter: Payer: Self-pay | Admitting: Surgery

## 2020-12-06 ENCOUNTER — Ambulatory Visit: Payer: Self-pay | Admitting: Surgery

## 2020-12-06 VITALS — BP 145/98 | HR 83 | Temp 98.3°F | Ht 62.0 in | Wt 212.0 lb

## 2020-12-06 DIAGNOSIS — K8010 Calculus of gallbladder with chronic cholecystitis without obstruction: Secondary | ICD-10-CM

## 2020-12-06 NOTE — Patient Instructions (Signed)
You have requested to have your gallbladder removed. This will be done at [facility] with Dr. Claudine Mouton.  You will most likely be out of work 1-2 weeks for this surgery. You will return after your post-op appointment with a lifting restriction for approximately 4 more weeks.  You will be able to eat anything you would like to following surgery. But, start by eating a bland diet and advance this as tolerated. The Gallbladder diet is below, please go as closely by this diet as possible prior to surgery to avoid any further attacks.  Please see the (blue)pre-care form that you have been given today. Our surgery scheduler will call you to look at surgery dates and to go over information.  If you have any questions, please call our office.  Laparoscopic Cholecystectomy Laparoscopic cholecystectomy is surgery to remove the gallbladder. The gallbladder is located in the upper right part of the abdomen, behind the liver. It is a storage sac for bile, which is produced in the liver. Bile aids in the digestion and absorption of fats. Cholecystectomy is often done for inflammation of the gallbladder (cholecystitis). This condition is usually caused by a buildup of gallstones (cholelithiasis) in the gallbladder. Gallstones can block the flow of bile, and that can result in inflammation and pain. In severe cases, emergency surgery may be required. If emergency surgery is not required, you will have time to prepare for the procedure. Laparoscopic surgery is an alternative to open surgery. Laparoscopic surgery has a shorter recovery time. Your common bile duct may also need to be examined during the procedure. If stones are found in the common bile duct, they may be removed. LET [facility] CARE PROVIDER KNOW ABOUT:  Any allergies you have.  All medicines you are taking, including vitamins, herbs, eye drops, creams, and over-the-counter medicines.  Previous problems you or members of your family have had  with the use of anesthetics.  Any blood disorders you have.  Previous surgeries you have had.    Any medical conditions you have. RISKS AND COMPLICATIONS Generally, this is a safe procedure. However, problems may occur, including:  Infection.  Bleeding.  Allergic reactions to medicines.  Damage to other structures or organs.  A stone remaining in the common bile duct.  A bile leak from the cyst duct that is clipped when your gallbladder is removed.  The need to convert to open surgery, which requires a larger incision in the abdomen. This may be necessary if your surgeon thinks that it is not safe to continue with a laparoscopic procedure. BEFORE THE PROCEDURE  Ask your health care provider about:  Changing or stopping your regular medicines. This is especially important if you are taking diabetes medicines or blood thinners.  Taking medicines such as aspirin and ibuprofen. These medicines can thin your blood. Do not take these medicines before your procedure if your health care provider instructs you not to.  Follow instructions from your health care provider about eating or drinking restrictions.  Let your health care provider know if you develop a cold or an infection before surgery.  Plan to have someone take you home after the procedure.  Ask your health care provider how your surgical site will be marked or identified.  You may be given antibiotic medicine to help prevent infection. PROCEDURE  To reduce your risk of infection:  Your health care team will wash or sanitize their hands.  Your skin will be washed with soap.  An IV tube may be  inserted into one of your veins.  You will be given a medicine to make you fall asleep (general anesthetic).  A breathing tube will be placed in your mouth.  The surgeon will make several small cuts (incisions) in your abdomen.  A thin, lighted tube (laparoscope) that has a tiny camera on the end will be inserted  through one of the small incisions. The camera on the laparoscope will send a picture to a TV screen (monitor) in the operating room. This will give the surgeon a good view inside your abdomen.  A gas will be pumped into your abdomen. This will expand your abdomen to give the surgeon more room to perform the surgery.  Other tools that are needed for the procedure will be inserted through the other incisions. The gallbladder will be removed through one of the incisions.  After your gallbladder has been removed, the incisions will be closed with stitches (sutures), staples, or skin glue.  Your incisions may be covered with a bandage (dressing). The procedure may vary among health care providers and hospitals. AFTER THE PROCEDURE  Your blood pressure, heart rate, breathing rate, and blood oxygen level will be monitored often until the medicines you were given have worn off.  You will be given medicines as needed to control your pain.   This information is not intended to replace advice given to you by your health care provider. Make sure you discuss any questions you have with your health care provider.   Document Released: 09/15/2005 Document Revised: 06/06/2015 Document Reviewed: 04/27/2013 Elsevier Interactive Patient Education 2016 [place] Diet for Gallbladder Conditions A low-fat diet can be helpful if you have pancreatitis or a gallbladder condition. With these conditions, your pancreas and gallbladder have trouble digesting fats. A healthy eating plan with less fat will help rest your pancreas and gallbladder and reduce your symptoms. WHAT DO I NEED TO KNOW ABOUT THIS DIET?  Eat a low-fat diet.  Reduce your fat intake to less than 20-30% of your total daily calories. This is less than 50-60 g of fat per day.  Remember that you need some fat in your diet. Ask your dietician what your daily goal should be.  Choose nonfat and low-fat healthy foods. Look for the words  "nonfat," "low fat," or "fat free."  As a guide, look on the label and choose foods with less than 3 g of fat per serving. Eat only one serving.  Avoid alcohol.  Do not smoke. If you need help quitting, talk with your health care provider.  Eat small frequent meals instead of three large heavy meals. WHAT FOODS CAN I EAT? Grains Include healthy grains and starches such as potatoes, wheat bread, fiber-rich cereal, and brown rice. Choose whole grain options whenever possible. In adults, whole grains should account for 45-65% of your daily calories.  Fruits and Vegetables Eat plenty of fruits and vegetables. Fresh fruits and vegetables add fiber to your diet. Meats and Other Protein Sources Eat lean meat such as chicken and pork. Trim any fat off of meat before cooking it. Eggs, fish, and beans are other sources of protein. In adults, these foods should account for 10-35% of your daily calories. Dairy Choose low-fat milk and dairy options. Dairy includes fat and protein, as well as calcium.  Fats and Oils Limit high-fat foods such as fried foods, sweets, baked goods, sugary drinks.  Other Creamy sauces and condiments, such as mayonnaise, can add extra fat. Think about whether  or not you need to use them, or use smaller amounts or low fat options. WHAT FOODS ARE NOT RECOMMENDED?  High fat foods, such as:  [employer].  Ice cream.  [place] toast.  Sweet rolls.  Pizza.  Cheese bread.  Foods covered with batter, butter, creamy sauces, or cheese.  Fried foods.  Sugary drinks and desserts.  Foods that cause gas or bloating   This information is not intended to replace advice given to you by your health care provider. Make sure you discuss any questions you have with your health care provider.   Document Released: 09/20/2013 Document Reviewed: 09/20/2013 Elsevier Interactive Patient Education [employer].

## 2020-12-06 NOTE — H&P (View-Only) (Signed)
Patient ID: Robin Salas, female   DOB: 01-30-1991, 30 y.o.   MRN: [id number]  Chief Complaint: Right upper quadrant pain, gallstones.  History of Present Illness Robin Salas is a 30 y.o. female with symptoms that likely extend back during her pregnancy when reviewing things retrospectively.  Her most recent exacerbations of pain began about 3 weeks ago, located right upper quadrant extending to sternum, radiating to her back and right shoulder.  She reports loose bowel movements and bloating.  She reports heartburn that has since persisted following her pregnancy.  She recently underwent work-up for possible gallstones, which confirmed their presence.    Past Medical History Past Medical History:  Diagnosis Date  . Anemia   . Fracture, pathologic, vertebra    last 4 vertabra  . Gestational diabetes   . Hyperhydrosis disorder   . Wedge deformity on x-ray of spine    last 4 vertebra of spine      Past Surgical History:  Procedure Laterality Date  . WISDOM TOOTH EXTRACTION      Allergies  Allergen Reactions  . Amoxicillin Hives  . Penicillins Hives    Current Outpatient Medications  Medication Sig Dispense Refill  . NEXPLANON 68 MG IMPL implant     . sertraline (ZOLOFT) 50 MG tablet Take 50 mg by mouth daily.     No current facility-administered medications for this visit.    Family History Family History  Problem Relation Age of Onset  . Prostate cancer Maternal Grandfather   . Bone cancer Maternal Grandfather   . Breast cancer Maternal Aunt       Social History Social History   Tobacco Use  . Smoking status: Never Smoker  . Smokeless tobacco: Never Used  Vaping Use  . Vaping Use: Never used  Substance Use Topics  . Alcohol use: No  . Drug use: No        Review of Systems  Constitutional: Positive for weight loss.  HENT: Negative.   Eyes: Negative.   Respiratory: Negative.   Cardiovascular: Negative.   Gastrointestinal: Positive for  abdominal pain, diarrhea, heartburn and nausea.  Genitourinary: Negative.   Skin: Negative.   Neurological: Negative.   Psychiatric/Behavioral: Negative.       Physical Exam Blood pressure (!) 145/98, pulse 83, temperature 98.3 F (36.8 C), height 5\' 2"  (1.575 m), weight 212 lb (96.2 kg), last menstrual period 12/02/2020, SpO2 99 %, not currently breastfeeding. Last Weight  Most recent update: 12/06/2020  1:43 PM   Weight  96.2 kg (212 lb)            CONSTITUTIONAL: Well developed, and nourished, appropriately responsive and aware without distress.  EYES: Sclera non-icteric.   EARS, NOSE, MOUTH AND THROAT: Mask worn.   The oropharynx is clear. Oral mucosa is pink and moist.   Hearing is intact to voice.  NECK: Trachea is midline, and there is no jugular venous distension.  LYMPH NODES:  Lymph nodes in the neck are not enlarged. RESPIRATORY:  Lungs are clear, and breath sounds are equal bilaterally. Normal respiratory effort without pathologic use of accessory muscles. CARDIOVASCULAR: Heart is regular in rate and rhythm. GI: The abdomen is soft, nontender, and nondistended. There were no palpable masses. I did not appreciate hepatosplenomegaly. There were normal bowel sounds.  MUSCULOSKELETAL:  Symmetrical muscle tone appreciated in all four extremities.    SKIN: Skin turgor is normal. No pathologic skin lesions appreciated.  NEUROLOGIC:  Motor and sensation appear grossly normal.  Cranial  nerves are grossly without defect. PSYCH:  Alert and oriented to person, place and time. Affect is appropriate for situation.  Data Reviewed I have personally reviewed what is currently available of the patient's imaging, recent labs and medical records.   Labs:  CBC Latest Ref Rng & Units 11/27/2020 04/07/2020 04/06/2020  WBC 4.0 - 10.5 K/uL 7.1 13.1(H) 10.5  Hemoglobin 12.0 - 15.0 g/dL 10.5(L) 7.5(L) 8.9(L)  Hematocrit 36.0 - 46.0 % 33.8(L) 24.8(L) 29.5(L)  Platelets 150.0 - 400.0 K/uL 255.0 189  188   CMP Latest Ref Rng & Units 11/27/2020 04/07/2020 04/06/2020  Glucose 70 - 99 mg/dL 91 83 81  BUN 6 - 23 mg/dL 11 6 7   Creatinine 0.40 - 1.20 mg/dL 2.40 9.73  Sodium 135 - 145 mEq/L 137 137 138  Potassium 3.5 - 5.1 mEq/L 4.1 4.0 4.1  Chloride 96 - 112 mEq/L 106 110 109  CO2 19 - 32 mEq/L 26 19(L) 19(L)  Calcium 8.4 - 10.5 mg/dL 9.4 5.32) 8.9  Total Protein 6.0 - 8.3 g/dL 7.6 5.0(L) 6.1(L)  Total Bilirubin 0.2 - 1.2 mg/dL 1.0 0.6 0.9  Alkaline Phos 39 - 117 U/L 82 172(H) 206(H)  AST 0 - 37 U/L 21 27 24   ALT 0 - 35 U/L 20 13 15       Imaging: Radiology review:  CLINICAL DATA:  Abdominal pain for 1 week.  EXAM: ULTRASOUND ABDOMEN LIMITED RIGHT UPPER QUADRANT  COMPARISON:  Abdominal ultrasound 12/02/2013  FINDINGS: Gallbladder:  There is a small gallstone measuring 7 mm. No gallbladder wall thickening. Negative sonographic Murphy sign.  Common bile duct:  Diameter: 0.2 cm, within normal limits  Liver:  No focal lesion identified. Parenchymal echogenicity is slightly increased. Portal vein is patent on color Doppler imaging with normal direction of blood flow towards the liver.  Other: None.  IMPRESSION: Is 1.  Cholelithiasis without evidence of cholecystitis.  2. Liver parenchymal echogenicity is slightly increased which is nonspecific but most commonly seen with hepatic steatosis.   Electronically Signed   By: M.D.   On: 11/29/2020 20:04  Within last 24 hrs: No results found.  Assessment    Chronic calculus cholecystitis. Patient Active Problem List   Diagnosis Date Noted  . RUQ abdominal pain 11/27/2020  . Acid reflux 11/27/2020  . Diarrhea 11/27/2020  . Normal postpartum course 04/08/2020  . Postpartum anxiety 04/08/2020  . Vacuum-assisted vaginal delivery 7/9 04/07/2020  . Acute blood loss anemia 04/07/2020  . Encounter for induction of labor 04/06/2020  . Abnormal glucose tolerance test (GTT) during  pregnancy, antepartum 12/24/2019  . Left wrist pain 05/18/2019  . Flu-like symptoms 11/24/2017  . Contraceptive management 08/28/2015  . Allergic rhinitis 03/01/2015  . PCOS (polycystic ovarian syndrome), likely 11/22/2012  . Hyperhidrosis, focal, primary 01/22/2011    Plan    Robotic cholecystectomy.  This was discussed thoroughly.  Optimal plan is for robotic cholecystectomy.  Risks and benefits have been discussed with the patient which include but are not limited to anesthesia, bleeding, infection, biliary ductal injury or stenosis, other associated unanticipated injuries affiliated with laparoscopic surgery.  I believe there is the desire to proceed, interpreter utilized as needed.  Questions elicited and answered to satisfaction.  No guarantees ever expressed or implied.   Face-to-face time spent with the patient and accompanying care providers(if present) was 30 minutes, with more than 50% of the time spent counseling, educating, and coordinating care of the patient.      05/01/2015 M.D.,  FACS 12/06/2020, 2:29 PM

## 2020-12-06 NOTE — Progress Notes (Signed)
Patient ID: Robin Salas, female   DOB: 01-30-1991, 30 y.o.   MRN: [id number]  Chief Complaint: Right upper quadrant pain, gallstones.  History of Present Illness Robin Salas is a 30 y.o. female with symptoms that likely extend back during her pregnancy when reviewing things retrospectively.  Her most recent exacerbations of pain began about 3 weeks ago, located right upper quadrant extending to sternum, radiating to her back and right shoulder.  She reports loose bowel movements and bloating.  She reports heartburn that has since persisted following her pregnancy.  She recently underwent work-up for possible gallstones, which confirmed their presence.    Past Medical History Past Medical History:  Diagnosis Date  . Anemia   . Fracture, pathologic, vertebra    last 4 vertabra  . Gestational diabetes   . Hyperhydrosis disorder   . Wedge deformity on x-ray of spine    last 4 vertebra of spine      Past Surgical History:  Procedure Laterality Date  . WISDOM TOOTH EXTRACTION      Allergies  Allergen Reactions  . Amoxicillin Hives  . Penicillins Hives    Current Outpatient Medications  Medication Sig Dispense Refill  . NEXPLANON 68 MG IMPL implant     . sertraline (ZOLOFT) 50 MG tablet Take 50 mg by mouth daily.     No current facility-administered medications for this visit.    Family History Family History  Problem Relation Age of Onset  . Prostate cancer Maternal Grandfather   . Bone cancer Maternal Grandfather   . Breast cancer Maternal Aunt       Social History Social History   Tobacco Use  . Smoking status: Never Smoker  . Smokeless tobacco: Never Used  Vaping Use  . Vaping Use: Never used  Substance Use Topics  . Alcohol use: No  . Drug use: No        Review of Systems  Constitutional: Positive for weight loss.  HENT: Negative.   Eyes: Negative.   Respiratory: Negative.   Cardiovascular: Negative.   Gastrointestinal: Positive for  abdominal pain, diarrhea, heartburn and nausea.  Genitourinary: Negative.   Skin: Negative.   Neurological: Negative.   Psychiatric/Behavioral: Negative.       Physical Exam Blood pressure (!) 145/98, pulse 83, temperature 98.3 F (36.8 C), height 5\' 2"  (1.575 m), weight 212 lb (96.2 kg), last menstrual period 12/02/2020, SpO2 99 %, not currently breastfeeding. Last Weight  Most recent update: 12/06/2020  1:43 PM   Weight  96.2 kg (212 lb)            CONSTITUTIONAL: Well developed, and nourished, appropriately responsive and aware without distress.  EYES: Sclera non-icteric.   EARS, NOSE, MOUTH AND THROAT: Mask worn.   The oropharynx is clear. Oral mucosa is pink and moist.   Hearing is intact to voice.  NECK: Trachea is midline, and there is no jugular venous distension.  LYMPH NODES:  Lymph nodes in the neck are not enlarged. RESPIRATORY:  Lungs are clear, and breath sounds are equal bilaterally. Normal respiratory effort without pathologic use of accessory muscles. CARDIOVASCULAR: Heart is regular in rate and rhythm. GI: The abdomen is soft, nontender, and nondistended. There were no palpable masses. I did not appreciate hepatosplenomegaly. There were normal bowel sounds.  MUSCULOSKELETAL:  Symmetrical muscle tone appreciated in all four extremities.    SKIN: Skin turgor is normal. No pathologic skin lesions appreciated.  NEUROLOGIC:  Motor and sensation appear grossly normal.  Cranial  nerves are grossly without defect. PSYCH:  Alert and oriented to person, place and time. Affect is appropriate for situation.  Data Reviewed I have personally reviewed what is currently available of the patient's imaging, recent labs and medical records.   Labs:  CBC Latest Ref Rng & Units 11/27/2020 04/07/2020 04/06/2020  WBC 4.0 - 10.5 K/uL 7.1 13.1(H) 10.5  Hemoglobin 12.0 - 15.0 g/dL 10.5(L) 7.5(L) 8.9(L)  Hematocrit 36.0 - 46.0 % 33.8(L) 24.8(L) 29.5(L)  Platelets 150.0 - 400.0 K/uL 255.0 189  188   CMP Latest Ref Rng & Units 11/27/2020 04/07/2020 04/06/2020  Glucose 70 - 99 mg/dL 91 83 81  BUN 6 - 23 mg/dL 11 6 7   Creatinine 0.40 - 1.20 mg/dL 2.40 9.73  Sodium 135 - 145 mEq/L 137 137 138  Potassium 3.5 - 5.1 mEq/L 4.1 4.0 4.1  Chloride 96 - 112 mEq/L 106 110 109  CO2 19 - 32 mEq/L 26 19(L) 19(L)  Calcium 8.4 - 10.5 mg/dL 9.4 5.32) 8.9  Total Protein 6.0 - 8.3 g/dL 7.6 5.0(L) 6.1(L)  Total Bilirubin 0.2 - 1.2 mg/dL 1.0 0.6 0.9  Alkaline Phos 39 - 117 U/L 82 172(H) 206(H)  AST 0 - 37 U/L 21 27 24   ALT 0 - 35 U/L 20 13 15       Imaging: Radiology review:  CLINICAL DATA:  Abdominal pain for 1 week.  EXAM: ULTRASOUND ABDOMEN LIMITED RIGHT UPPER QUADRANT  COMPARISON:  Abdominal ultrasound 12/02/2013  FINDINGS: Gallbladder:  There is a small gallstone measuring 7 mm. No gallbladder wall thickening. Negative sonographic Murphy sign.  Common bile duct:  Diameter: 0.2 cm, within normal limits  Liver:  No focal lesion identified. Parenchymal echogenicity is slightly increased. Portal vein is patent on color Doppler imaging with normal direction of blood flow towards the liver.  Other: None.  IMPRESSION: Is 1.  Cholelithiasis without evidence of cholecystitis.  2. Liver parenchymal echogenicity is slightly increased which is nonspecific but most commonly seen with hepatic steatosis.   Electronically Signed   By: M.D.   On: 11/29/2020 20:04  Within last 24 hrs: No results found.  Assessment    Chronic calculus cholecystitis. Patient Active Problem List   Diagnosis Date Noted  . RUQ abdominal pain 11/27/2020  . Acid reflux 11/27/2020  . Diarrhea 11/27/2020  . Normal postpartum course 04/08/2020  . Postpartum anxiety 04/08/2020  . Vacuum-assisted vaginal delivery 7/9 04/07/2020  . Acute blood loss anemia 04/07/2020  . Encounter for induction of labor 04/06/2020  . Abnormal glucose tolerance test (GTT) during  pregnancy, antepartum 12/24/2019  . Left wrist pain 05/18/2019  . Flu-like symptoms 11/24/2017  . Contraceptive management 08/28/2015  . Allergic rhinitis 03/01/2015  . PCOS (polycystic ovarian syndrome), likely 11/22/2012  . Hyperhidrosis, focal, primary 01/22/2011    Plan    Robotic cholecystectomy.  This was discussed thoroughly.  Optimal plan is for robotic cholecystectomy.  Risks and benefits have been discussed with the patient which include but are not limited to anesthesia, bleeding, infection, biliary ductal injury or stenosis, other associated unanticipated injuries affiliated with laparoscopic surgery.  I believe there is the desire to proceed, interpreter utilized as needed.  Questions elicited and answered to satisfaction.  No guarantees ever expressed or implied.   Face-to-face time spent with the patient and accompanying care providers(if present) was 30 minutes, with more than 50% of the time spent counseling, educating, and coordinating care of the patient.      05/01/2015 M.D.,  FACS 12/06/2020, 2:29 PM     

## 2020-12-07 ENCOUNTER — Telehealth: Payer: Self-pay | Admitting: Surgery

## 2020-12-07 NOTE — Telephone Encounter (Signed)
Patient has been advised of Pre-Admission date/time, COVID Testing date and Surgery date.  Surgery Date: 12/12/20 Preadmission Testing Date: 12/11/20 (phone 1p-5p) Covid Testing Date: 12/10/20 in person @ 11:10 am  - patient advised to go to [facility] ([address])   Patient has been made aware to call [phone], between 1-3:00pm the day before surgery, to find out what time to arrive for surgery.

## 2020-12-10 ENCOUNTER — Other Ambulatory Visit: Payer: Self-pay

## 2020-12-10 ENCOUNTER — Other Ambulatory Visit
Admission: RE | Admit: 2020-12-10 | Discharge: 2020-12-10 | Disposition: A | Discharge status: Home / Self Care | Payer: 59 | Source: Ambulatory Visit | Attending: Surgery | Admitting: Surgery

## 2020-12-10 DIAGNOSIS — Z20822 Contact with and (suspected) exposure to covid-19: Secondary | ICD-10-CM | POA: Diagnosis not present

## 2020-12-10 DIAGNOSIS — Z01812 Encounter for preprocedural laboratory examination: Secondary | ICD-10-CM | POA: Diagnosis not present

## 2020-12-10 LAB — SARS CORONAVIRUS 2 (TAT 6-24 HRS): SARS Coronavirus 2: NEGATIVE

## 2020-12-11 ENCOUNTER — Other Ambulatory Visit
Admission: RE | Admit: 2020-12-11 | Discharge: 2020-12-11 | Disposition: A | Discharge status: Home / Self Care | Payer: 59 | Source: Ambulatory Visit | Attending: Surgery | Admitting: Surgery

## 2020-12-11 DIAGNOSIS — Z01818 Encounter for other preprocedural examination: Secondary | ICD-10-CM | POA: Insufficient documentation

## 2020-12-11 MED ORDER — CIPROFLOXACIN IN D5W 400 MG/200ML IV SOLN
400.0000 mg | INTRAVENOUS | Status: AC
  Administered 2020-12-12: 400 mg via INTRAVENOUS

## 2020-12-11 MED ORDER — CHLORHEXIDINE GLUCONATE CLOTH 2 % EX PADS: 6.0000 | MEDICATED_PAD | Freq: Once | CUTANEOUS | Status: DC

## 2020-12-11 MED ORDER — INDOCYANINE GREEN 25 MG IV SOLR
2.5000 mg | Freq: Once | INTRAVENOUS | Status: AC
  Administered 2020-12-12: 2.5 mg via INTRAVENOUS
  Filled 2020-12-11: qty 1

## 2020-12-11 NOTE — Patient Instructions (Signed)
Your procedure is scheduled on: Wednesday 12/12/20. Your arrival time is 11:30am.  Report to THE FIRST FLOOR REGISTRATION DESK IN THE MEDICAL MALL ON THE MORNING OF SURGERY FIRST, THEN YOU WILL CHECK IN AT THE SURGERY INFORMATION DESK LOCATED OUTSIDE THE SAME DAY SURGERY DEPARTMENT LOCATED ON 2ND FLOOR MEDICAL MALL ENTRANCE.   Remember: Instructions that are not followed completely may result in serious medical risk, up to and including death, or upon the discretion of your surgeon and anesthesiologist your surgery may need to be rescheduled.     __X__ 1. Do not eat food after midnight the night before your procedure.                 No gum chewing or hard candies. You may drink clear liquids up to 2 hours                 before you are scheduled to arrive for your surgery- DO NOT drink clear                 liquids within 2 hours of the start of your surgery.                 Clear Liquids include:  water, apple juice without pulp, clear carbohydrate                 drink such as Clearfast or Gatorade, Black Coffee or Tea (Do not add                 milk or creamer to coffee or tea).  __X__2.  On the morning of surgery brush your teeth with toothpaste and water, you may rinse your mouth with mouthwash if you wish.  Do not swallow any toothpaste or mouthwash.    __X__ 3.  No Alcohol for 24 hours before or after surgery.  __X__ 4.  Do Not Smoke or use e-cigarettes For 24 Hours Prior to Your Surgery.                 Do not use any chewable tobacco products for at least 6 hours prior to                 surgery.  __X__5.  Notify your doctor if there is any change in your medical condition      (cold, fever, infections).      Do NOT wear jewelry, make-up, hairpins, clips or nail polish. Do NOT wear lotions, powders, or perfumes.  Do NOT shave 48 hours prior to surgery. Men may shave face and neck. Do NOT bring valuables to the hospital.     [facility] is not responsible for any belongings  or valuables.   Contacts, dentures/partials or body piercings may not be worn into surgery. Bring a case for your contacts, glasses or hearing aids, a denture cup will be supplied.    Patients discharged the day of surgery will not be allowed to drive home.     __X__ Take these medicines the morning of surgery with A SIP OF WATER:     1. esomeprazole (NEXIUM)    __X__ Shower before arrival.   __X__ Stop Anti-inflammatories 7 days before surgery such as Advil, Ibuprofen, Motrin, BC or Goodies Powder, Naprosyn, Naproxen, Aleve, Aspirin, Meloxicam. May take Tylenol if needed for pain or discomfort.   __X__Do not start taking any new herbal supplements or vitamins prior to your procedure.     Wear comfortable clothing (specific to your  surgery type) to the hospital.  Plan for stool softeners for home use; pain medications have a tendency to cause constipation. You can also help prevent constipation by eating foods high in fiber such as fruits and vegetables and drinking plenty of fluids as your diet allows.  After surgery, you can prevent lung complications by doing breathing exercises.Take deep breaths and cough every 1-2 hours. Your doctor may order a device called an Incentive Spirometer to help you take deep breaths.  Please call the [place] Department at [phone] if you have any questions about these instructions.

## 2020-12-12 ENCOUNTER — Other Ambulatory Visit: Payer: Self-pay

## 2020-12-12 ENCOUNTER — Ambulatory Visit: Payer: 59 | Admitting: Anesthesiology

## 2020-12-12 ENCOUNTER — Encounter
Admission: RE | Disposition: A | Discharge status: Home / Self Care | Payer: Self-pay | Source: Home / Self Care | Attending: Surgery

## 2020-12-12 ENCOUNTER — Ambulatory Visit
Admission: RE | Admit: 2020-12-12 | Discharge: 2020-12-12 | Disposition: A | Discharge status: Home / Self Care | Payer: 59 | Attending: Surgery | Admitting: Surgery

## 2020-12-12 ENCOUNTER — Encounter: Payer: Self-pay | Admitting: Surgery

## 2020-12-12 ENCOUNTER — Other Ambulatory Visit: Payer: Self-pay | Admitting: Physician Assistant

## 2020-12-12 DIAGNOSIS — Z793 Long term (current) use of hormonal contraceptives: Secondary | ICD-10-CM | POA: Insufficient documentation

## 2020-12-12 DIAGNOSIS — K8010 Calculus of gallbladder with chronic cholecystitis without obstruction: Secondary | ICD-10-CM | POA: Diagnosis not present

## 2020-12-12 DIAGNOSIS — Z8759 Personal history of other complications of pregnancy, childbirth and the puerperium: Secondary | ICD-10-CM | POA: Insufficient documentation

## 2020-12-12 DIAGNOSIS — Z79899 Other long term (current) drug therapy: Secondary | ICD-10-CM | POA: Diagnosis not present

## 2020-12-12 DIAGNOSIS — Z880 Allergy status to penicillin: Secondary | ICD-10-CM | POA: Insufficient documentation

## 2020-12-12 DIAGNOSIS — Z8632 Personal history of gestational diabetes: Secondary | ICD-10-CM | POA: Insufficient documentation

## 2020-12-12 LAB — POCT PREGNANCY, URINE: Preg Test, Ur: NEGATIVE

## 2020-12-12 SURGERY — CHOLECYSTECTOMY, ROBOT-ASSISTED, LAPAROSCOPIC
Anesthesia: General | Site: Abdomen

## 2020-12-12 MED ORDER — LIDOCAINE HCL (PF) 2 % IJ SOLN
INTRAMUSCULAR | Status: AC
  Filled 2020-12-12: qty 5

## 2020-12-12 MED ORDER — ONDANSETRON HCL 4 MG/2ML IJ SOLN
INTRAMUSCULAR | Status: DC | PRN
  Administered 2020-12-12: 4 mg via INTRAVENOUS

## 2020-12-12 MED ORDER — DEXMEDETOMIDINE (PRECEDEX) IN NS 20 MCG/5ML (4 MCG/ML) IV SYRINGE
PREFILLED_SYRINGE | INTRAVENOUS | Status: DC | PRN
  Administered 2020-12-12: 8 ug via INTRAVENOUS
  Administered 2020-12-12: 12 ug via INTRAVENOUS

## 2020-12-12 MED ORDER — LIDOCAINE HCL (CARDIAC) PF 100 MG/5ML IV SOSY
PREFILLED_SYRINGE | INTRAVENOUS | Status: DC | PRN
  Administered 2020-12-12: 80 mg via INTRAVENOUS

## 2020-12-12 MED ORDER — DEXAMETHASONE SODIUM PHOSPHATE 10 MG/ML IJ SOLN
INTRAMUSCULAR | Status: AC
  Filled 2020-12-12: qty 1

## 2020-12-12 MED ORDER — ONDANSETRON HCL 4 MG/2ML IJ SOLN
INTRAMUSCULAR | Status: AC
  Filled 2020-12-12: qty 2

## 2020-12-12 MED ORDER — BUPIVACAINE-EPINEPHRINE (PF) 0.25% -1:200000 IJ SOLN
INTRAMUSCULAR | Status: AC
  Filled 2020-12-12: qty 30

## 2020-12-12 MED ORDER — PROPOFOL 10 MG/ML IV BOLUS
INTRAVENOUS | Status: AC
  Filled 2020-12-12: qty 20

## 2020-12-12 MED ORDER — DEXMEDETOMIDINE (PRECEDEX) IN NS 20 MCG/5ML (4 MCG/ML) IV SYRINGE
PREFILLED_SYRINGE | INTRAVENOUS | Status: AC
  Filled 2020-12-12: qty 5

## 2020-12-12 MED ORDER — CHLORHEXIDINE GLUCONATE 0.12 % MT SOLN: 15.0000 mL | Freq: Once | OROMUCOSAL | Status: AC

## 2020-12-12 MED ORDER — DEXAMETHASONE SODIUM PHOSPHATE 10 MG/ML IJ SOLN
INTRAMUSCULAR | Status: DC | PRN
  Administered 2020-12-12: 10 mg via INTRAVENOUS

## 2020-12-12 MED ORDER — SODIUM CHLORIDE FLUSH 0.9 % IV SOLN
INTRAVENOUS | Status: AC
  Filled 2020-12-12: qty 10

## 2020-12-12 MED ORDER — HYDROCODONE-ACETAMINOPHEN 5-325 MG PO TABS: 1.0000 | ORAL_TABLET | Freq: Four times a day (QID) | ORAL | 0 refills | Status: DC | PRN

## 2020-12-12 MED ORDER — FENTANYL CITRATE (PF) 100 MCG/2ML IJ SOLN
INTRAMUSCULAR | Status: DC | PRN
  Administered 2020-12-12: 100 ug via INTRAVENOUS

## 2020-12-12 MED ORDER — LACTATED RINGERS IV SOLN: INTRAVENOUS | Status: DC

## 2020-12-12 MED ORDER — FENTANYL CITRATE (PF) 100 MCG/2ML IJ SOLN
INTRAMUSCULAR | Status: AC
  Filled 2020-12-12: qty 2

## 2020-12-12 MED ORDER — OXYCODONE HCL 5 MG PO TABS: 5.0000 mg | ORAL_TABLET | Freq: Four times a day (QID) | ORAL | 0 refills | Status: DC | PRN

## 2020-12-12 MED ORDER — MIDAZOLAM HCL 2 MG/2ML IJ SOLN
INTRAMUSCULAR | Status: DC | PRN
  Administered 2020-12-12: 2 mg via INTRAVENOUS

## 2020-12-12 MED ORDER — BUPIVACAINE LIPOSOME 1.3 % IJ SUSP
INTRAMUSCULAR | Status: AC
  Filled 2020-12-12: qty 20

## 2020-12-12 MED ORDER — ACETAMINOPHEN 10 MG/ML IV SOLN
INTRAVENOUS | Status: AC
  Filled 2020-12-12: qty 100

## 2020-12-12 MED ORDER — ONDANSETRON HCL 4 MG/2ML IJ SOLN
INTRAMUSCULAR | Status: AC
  Administered 2020-12-12: 4 mg via INTRAVENOUS
  Filled 2020-12-12: qty 2

## 2020-12-12 MED ORDER — MIDAZOLAM HCL 2 MG/2ML IJ SOLN
INTRAMUSCULAR | Status: AC
  Filled 2020-12-12: qty 2

## 2020-12-12 MED ORDER — ROCURONIUM BROMIDE 10 MG/ML (PF) SYRINGE
PREFILLED_SYRINGE | INTRAVENOUS | Status: AC
  Filled 2020-12-12: qty 10

## 2020-12-12 MED ORDER — CIPROFLOXACIN IN D5W 400 MG/200ML IV SOLN
INTRAVENOUS | Status: AC
  Filled 2020-12-12: qty 200

## 2020-12-12 MED ORDER — BUPIVACAINE-EPINEPHRINE (PF) 0.25% -1:200000 IJ SOLN
INTRAMUSCULAR | Status: DC | PRN
  Administered 2020-12-12: 30 mL

## 2020-12-12 MED ORDER — ROCURONIUM BROMIDE 100 MG/10ML IV SOLN
INTRAVENOUS | Status: DC | PRN
  Administered 2020-12-12: 50 mg via INTRAVENOUS
  Administered 2020-12-12: 10 mg via INTRAVENOUS

## 2020-12-12 MED ORDER — FENTANYL CITRATE (PF) 100 MCG/2ML IJ SOLN
INTRAMUSCULAR | Status: AC
  Administered 2020-12-12: 25 ug via INTRAVENOUS
  Filled 2020-12-12: qty 2

## 2020-12-12 MED ORDER — ORAL CARE MOUTH RINSE: 15.0000 mL | Freq: Once | OROMUCOSAL | Status: AC

## 2020-12-12 MED ORDER — FENTANYL CITRATE (PF) 100 MCG/2ML IJ SOLN
25.0000 ug | INTRAMUSCULAR | Status: DC | PRN
Start: 2020-12-12 — End: 2020-12-12
  Administered 2020-12-12 (×3): 25 ug via INTRAVENOUS

## 2020-12-12 MED ORDER — SUGAMMADEX SODIUM 200 MG/2ML IV SOLN
INTRAVENOUS | Status: DC | PRN
  Administered 2020-12-12: 300 mg via INTRAVENOUS

## 2020-12-12 MED ORDER — CHLORHEXIDINE GLUCONATE 0.12 % MT SOLN
OROMUCOSAL | Status: AC
  Administered 2020-12-12: 15 mL via OROMUCOSAL
  Filled 2020-12-12: qty 15

## 2020-12-12 MED ORDER — PROPOFOL 10 MG/ML IV BOLUS
INTRAVENOUS | Status: DC | PRN
  Administered 2020-12-12: 200 mg via INTRAVENOUS

## 2020-12-12 MED ORDER — ONDANSETRON HCL 4 MG/2ML IJ SOLN: 4.0000 mg | Freq: Once | INTRAMUSCULAR | Status: AC | PRN

## 2020-12-12 MED ORDER — PHENYLEPHRINE HCL (PRESSORS) 10 MG/ML IV SOLN
INTRAVENOUS | Status: DC | PRN
  Administered 2020-12-12: 100 ug via INTRAVENOUS

## 2020-12-12 MED ORDER — ACETAMINOPHEN 10 MG/ML IV SOLN
INTRAVENOUS | Status: DC | PRN
  Administered 2020-12-12: 1000 mg via INTRAVENOUS

## 2020-12-12 MED ORDER — IBUPROFEN 800 MG PO TABS: 800.0000 mg | ORAL_TABLET | Freq: Three times a day (TID) | ORAL | 0 refills | Status: DC | PRN

## 2020-12-12 SURGICAL SUPPLY — 45 items
CANISTER SUCT 1200ML W/VALVE (MISCELLANEOUS) ×3 IMPLANT
CHLORAPREP W/TINT 26 (MISCELLANEOUS) ×3 IMPLANT
CLIP VESOLOCK MED LG 6/CT (CLIP) ×3 IMPLANT
COVER TIP SHEARS 8 DVNC (MISCELLANEOUS) ×2 IMPLANT
COVER TIP SHEARS 8MM DA VINCI (MISCELLANEOUS) ×1
COVER WAND RF STERILE (DRAPES) ×3 IMPLANT
DECANTER SPIKE VIAL GLASS SM (MISCELLANEOUS) ×3 IMPLANT
DEFOGGER SCOPE WARMER CLEARIFY (MISCELLANEOUS) ×3 IMPLANT
DERMABOND ADVANCED (GAUZE/BANDAGES/DRESSINGS) ×1
DERMABOND ADVANCED .7 DNX12 (GAUZE/BANDAGES/DRESSINGS) ×2 IMPLANT
DRAPE ARM DVNC X/XI (DISPOSABLE) ×8 IMPLANT
DRAPE COLUMN DVNC XI (DISPOSABLE) ×2 IMPLANT
DRAPE DA VINCI XI ARM (DISPOSABLE) ×4
DRAPE DA VINCI XI COLUMN (DISPOSABLE) ×1
ELECT CAUTERY BLADE 6.4 (BLADE) ×3 IMPLANT
GLOVE ORTHO TXT STRL SZ7.5 (GLOVE) ×6 IMPLANT
GOWN STRL REUS W/ TWL LRG LVL3 (GOWN DISPOSABLE) ×8 IMPLANT
GOWN STRL REUS W/TWL LRG LVL3 (GOWN DISPOSABLE) ×4
GRASPER SUT TROCAR 14GX15 (MISCELLANEOUS) IMPLANT
INFUSOR MANOMETER BAG 3000ML (MISCELLANEOUS) IMPLANT
IRRIGATION STRYKERFLOW (MISCELLANEOUS) IMPLANT
IRRIGATOR STRYKERFLOW (MISCELLANEOUS)
IRRIGATOR SUCT 8 DISP DVNC XI (IRRIGATION / IRRIGATOR) IMPLANT
IRRIGATOR SUCTION 8MM XI DISP (IRRIGATION / IRRIGATOR)
IV NS IRRIG 3000ML ARTHROMATIC (IV SOLUTION) IMPLANT
KIT PINK PAD W/HEAD ARE REST (MISCELLANEOUS) ×3
KIT PINK PAD W/HEAD ARM REST (MISCELLANEOUS) ×2 IMPLANT
KIT TURNOVER KIT A (KITS) ×3 IMPLANT
LABEL OR SOLS (LABEL) ×3 IMPLANT
MANIFOLD NEPTUNE II (INSTRUMENTS) ×3 IMPLANT
NEEDLE HYPO 22GX1.5 SAFETY (NEEDLE) ×3 IMPLANT
NEEDLE INSUFFLATION 14GA 120MM (NEEDLE) IMPLANT
NS IRRIG 500ML POUR BTL (IV SOLUTION) ×3 IMPLANT
PACK LAP CHOLECYSTECTOMY (MISCELLANEOUS) ×3 IMPLANT
PENCIL ELECTRO HAND CTR (MISCELLANEOUS) ×3 IMPLANT
POUCH SPECIMEN RETRIEVAL 10MM (ENDOMECHANICALS) ×3 IMPLANT
SEAL CANN UNIV 5-8 DVNC XI (MISCELLANEOUS) ×8 IMPLANT
SEAL XI 5MM-8MM UNIVERSAL (MISCELLANEOUS) ×4
SET TUBE SMOKE EVAC HIGH FLOW (TUBING) ×3 IMPLANT
SOLUTION ELECTROLUBE (MISCELLANEOUS) ×3 IMPLANT
SUT MNCRL 4-0 (SUTURE) ×2
SUT MNCRL 4-0 27XMFL (SUTURE) ×4
SUT VICRYL 0 AB UR-6 (SUTURE) ×6 IMPLANT
SUTURE MNCRL 4-0 27XMF (SUTURE) ×4 IMPLANT
TROCAR Z-THREAD FIOS 11X100 BL (TROCAR) ×3 IMPLANT

## 2020-12-12 NOTE — Op Note (Signed)
Robotic cholecystectomy  Pre-operative Diagnosis: Chronic calculus cholecystitis  Post-operative Diagnosis:  Same.  Procedure: Robotic assisted laparoscopic cholecystectomy.  Surgeon: [name], M.D., FACS  Anesthesia: General. with endotracheal tube  Findings: No remarkable inflammatory changes, mild duodenal adhesions to the gallbladder neck.  Estimated Blood Loss: 5 mL         Drains: None         Specimens: Gallbladder           Complications: none  Procedure Details  The patient was seen again in the Holding Room.  1.25-2.5 mg dose of ICG was administered intravenously.  The benefits, complications, treatment options, risks and expected outcomes were discussed with the patient. The likelihood of improving the patient's symptoms with return to their baseline status is good.  The patient and/or family concurred with the proposed plan, giving informed consent, again alternatives reviewed.  The patient was taken to Operating Room, identified, and the procedure verified as robotic assisted laparoscopic cholecystectomy.  Prior to the induction of general anesthesia, antibiotic prophylaxis was administered. VTE prophylaxis was in place. General endotracheal anesthesia was then administered and tolerated well. The patient was positioned in the supine position.  After the induction, the abdomen was prepped with Chloraprep and draped in the sterile fashion.  A Time Out was held and the above information confirmed.   Right infra-umbilical local infiltration with quarter percent Marcaine with epinephrine is utilized.  Made a 12 mm incision on the left periumbilical site, I advanced an optical 16mm port under direct visualization into the peritoneal cavity.  Once the peritoneum was penetrated, insufflation was transferred.  The trocar was then advanced into the abdominal cavity under direct visualization. Pneumoperitoneum was then continued with CO2 at 14 mmHg or less and tolerated well  without any adverse changes in the patient's vital signs.  Two 8.5-mm ports were placed in the left lower quadrant and laterally, and one to the right lower quadrant, all under direct vision. All skin incisions  were infiltrated with a local anesthetic agent before making the incision and placing the trocars.  The patient was positioned  in reverse Trendelenburg, tilted the patient's left side down.  Da Vinci XI robot was then positioned on to the patient's left side, and docked.  The gallbladder was identified, the fundus grasped via the arm 4 Prograsp and retracted cephalad. Adhesions were lysed with scissors and cautery.  The infundibulum was identified grasped and retracted laterally, exposing the peritoneum overlying the triangle of Calot. This was then opened and dissected using cautery & scissors. An extended critical view of the cystic duct and cystic artery was obtained, aided by the ICG via FireFly which enabled ready visualization of the ductal anatomy.    The cystic duct was clearly identified and dissected to isolation.   Artery well isolated and sealed with bipolar cautery, and the cystic duct was triple clipped and divided with scissors, leaving two on the remaining stump.  The specimen side of the artery was sealed with bipolar and divided with monopolar scissors.   The gallbladder was taken from the gallbladder fossa in a retrograde fashion with the electrocautery. The gallbladder was removed and placed in an Endocatch bag.  The liver bed is inspected. Hemostasis was confirmed.  The robot was undocked and moved away from the operative field. No irrigation was utilized.  The gallbladder and Endocatch sac were then removed through the infraumbilical port site.   Inspection of the right upper quadrant was performed. No bleeding, bile duct  injury or leak, or bowel injury was noted. The infra-umbilical port site fascia was closed with interrumpted 0 Vicryl sutures using PMI/cone under direct  visualization. Pneumoperitoneum was released and ports removed.  4-0 subcuticular Monocryl was used to close the skin. Dermabond was  applied.  The patient was then extubated and brought to the recovery room in stable condition. Sponge, lap, and needle counts were correct at closure and at the conclusion of the case.               [name], M.D., [facility] 12/12/2020 2:10 PM

## 2020-12-12 NOTE — Interval H&P Note (Signed)
History and Physical Interval Note:  12/12/2020 12:49 PM  Robin Salas  has presented today for surgery, with the diagnosis of chronic calculous cholelithiasis.  The various methods of treatment have been discussed with the patient and family. After consideration of risks, benefits and other options for treatment, the patient has consented to  Procedure(s): XI ROBOTIC ASSISTED LAPAROSCOPIC CHOLECYSTECTOMY (N/A) as a surgical intervention.  The patient's history has been reviewed, patient examined, no change in status, stable for surgery.  I have reviewed the patient's chart and labs.  Questions were answered to the patient's satisfaction.     [name]

## 2020-12-12 NOTE — Anesthesia Procedure Notes (Signed)
Procedure Name: Intubation Date/Time: 12/12/2020 1:08 PM Performed by: Jaye Beagle, CRNA Pre-anesthesia Checklist: Patient identified, Emergency Drugs available, Suction available and Patient being monitored Patient Re-evaluated:Patient Re-evaluated prior to induction Oxygen Delivery Method: Circle system utilized Preoxygenation: Pre-oxygenation with 100% oxygen Induction Type: IV induction Ventilation: Mask ventilation without difficulty Laryngoscope Size: McGraph and 3 Grade View: Grade I Tube type: Oral Tube size: 7.0 mm Number of attempts: 1 Airway Equipment and Method: Stylet and Oral airway Placement Confirmation: ETT inserted through vocal cords under direct vision,  positive ETCO2 and breath sounds checked- equal and bilateral Secured at: 21 cm Tube secured with: Tape Dental Injury: Teeth and Oropharynx as per pre-operative assessment

## 2020-12-12 NOTE — Discharge Instructions (Signed)

## 2020-12-12 NOTE — Anesthesia Preprocedure Evaluation (Signed)
Anesthesia Evaluation  Patient identified by MRN, date of birth, ID band Patient awake    Reviewed: Allergy & Precautions, NPO status , Patient's Chart, lab work & pertinent test results  Airway Mallampati: II  TM Distance: >3 FB     Dental  (+) Teeth Intact   Pulmonary neg pulmonary ROS,    Pulmonary exam normal        Cardiovascular negative cardio ROS Normal cardiovascular exam     Neuro/Psych Anxiety negative neurological ROS     GI/Hepatic Neg liver ROS, GERD  Controlled,  Endo/Other  negative endocrine ROS  Renal/GU negative Renal ROS     Musculoskeletal   Abdominal Normal abdominal exam  (+)   Peds negative pediatric ROS (+)  Hematology  (+) anemia ,   Anesthesia Other Findings Past Medical History: No date: Anemia No date: Fracture, pathologic, vertebra     Comment:  last 4 vertabra No date: Hyperhydrosis disorder No date: Wedge deformity on x-ray of spine     Comment:  last 4 vertebra of spine  Reproductive/Obstetrics                             Anesthesia Physical Anesthesia Plan  ASA: II  Anesthesia Plan: General   Post-op Pain Management:    Induction: Intravenous  PONV Risk Score and Plan:   Airway Management Planned: Oral ETT  Additional Equipment:   Intra-op Plan:   Post-operative Plan:   Informed Consent: I have reviewed the patients History and Physical, chart, labs and discussed the procedure including the risks, benefits and alternatives for the proposed anesthesia with the patient or authorized representative who has indicated his/her understanding and acceptance.     Dental advisory given  Plan Discussed with: CRNA and Surgeon  Anesthesia Plan Comments:         Anesthesia Quick Evaluation

## 2020-12-12 NOTE — Transfer of Care (Signed)
Immediate Anesthesia Transfer of Care Note  Patient: Robin Salas  Procedure(s) Performed: XI ROBOTIC ASSISTED LAPAROSCOPIC CHOLECYSTECTOMY (N/A Abdomen) INDOCYANINE GREEN FLUORESCENCE IMAGING (ICG)  Patient Location: PACU  Anesthesia Type:General  Level of Consciousness: drowsy  Airway & Oxygen Therapy: Patient Spontanous Breathing and Patient connected to face mask oxygen  Post-op Assessment: Report given to RN  Post vital signs: stable  Last Vitals:  Vitals Value Taken Time  BP 161/117 12/12/20 1412  Temp    Pulse 83 12/12/20 1413  Resp    SpO2 100 % 12/12/20 1413  Vitals shown include unvalidated device data.  Last Pain:  Vitals:   12/12/20 1149  TempSrc: Oral  PainSc: 0-No pain         Complications: No complications documented.

## 2020-12-13 NOTE — Anesthesia Postprocedure Evaluation (Signed)
Anesthesia Post Note  Patient: Robin Salas  Procedure(s) Performed: XI ROBOTIC ASSISTED LAPAROSCOPIC CHOLECYSTECTOMY (N/A Abdomen) INDOCYANINE GREEN FLUORESCENCE IMAGING (ICG)  Patient location during evaluation: PACU Anesthesia Type: General Level of consciousness: awake and alert and oriented Pain management: pain level controlled Vital Signs Assessment: post-procedure vital signs reviewed and stable Respiratory status: spontaneous breathing Cardiovascular status: blood pressure returned to baseline Anesthetic complications: no   No complications documented.   Last Vitals:  Vitals:   12/12/20 1514 12/12/20 1535  BP:  127/68  Pulse: 78 85  Resp: 18 20  Temp: 36.8 C   SpO2: 100% 100%    Last Pain:  Vitals:   12/12/20 1514  TempSrc: Tympanic  PainSc: 4                  Artur Winningham

## 2020-12-14 LAB — SURGICAL PATHOLOGY

## 2020-12-25 ENCOUNTER — Encounter: Payer: 59 | Admitting: Surgery

## 2021-01-01 ENCOUNTER — Other Ambulatory Visit: Payer: Self-pay

## 2021-01-01 ENCOUNTER — Encounter: Payer: Self-pay | Admitting: Surgery

## 2021-01-01 ENCOUNTER — Ambulatory Visit (INDEPENDENT_AMBULATORY_CARE_PROVIDER_SITE_OTHER): Payer: 59 | Admitting: Surgery

## 2021-01-01 VITALS — BP 132/88 | HR 75 | Temp 99.3°F | Ht 62.0 in | Wt 211.0 lb

## 2021-01-01 DIAGNOSIS — Z9049 Acquired absence of other specified parts of digestive tract: Secondary | ICD-10-CM | POA: Insufficient documentation

## 2021-01-01 HISTORY — DX: Acquired absence of other specified parts of digestive tract: Z90.49

## 2021-01-01 NOTE — Progress Notes (Signed)
[facility] POST-OP OFFICE VISIT  01/01/2021  HPI: Robin Salas is a 30 y.o. female 20 days s/p robotic cholecystectomy.  She reports having a regular bowel movements with some diarrhea which was associated as well with a gastroenteritis that went through their family.  She currently denies any nausea, vomiting, fevers or chills.  She denies any draining or issues from her incisions.  Vital signs: BP 132/88   Pulse 75   Temp 99.3 F (37.4 C) (Oral)   Ht 5\' 2"  (1.575 m)   Wt 211 lb (95.7 kg)   LMP 12/02/2020   SpO2 98%   BMI 38.59 kg/m    Physical Exam: Constitutional: She appears well. Abdomen: Benign, soft nontender, nondistended. Skin: Incisions are clean, dry and intact.  No underlying induration, or mass-effect.  Assessment/Plan: This is a 30 y.o. female 20 days s/p robotic cholecystectomy, peers to be doing well.  Patient Active Problem List   Diagnosis Date Noted  . CCC (chronic calculous cholecystitis) 12/06/2020  . RUQ abdominal pain 11/27/2020  . Acid reflux 11/27/2020  . Diarrhea 11/27/2020  . Normal postpartum course 04/08/2020  . Postpartum anxiety 04/08/2020  . Vacuum-assisted vaginal delivery 7/9 04/07/2020  . Acute blood loss anemia 04/07/2020  . Encounter for induction of labor 04/06/2020  . Abnormal glucose tolerance test (GTT) during pregnancy, antepartum 12/24/2019  . Left wrist pain 05/18/2019  . Flu-like symptoms 11/24/2017  . Contraceptive management 08/28/2015  . Allergic rhinitis 03/01/2015  . PCOS (polycystic ovarian syndrome), likely 11/22/2012  . Hyperhidrosis, focal, primary 01/22/2011    -Has resumed work, she is already driving.  She may follow-up as needed.   01/24/2011 M.D., FACS 01/01/2021, 12:13 PM

## 2021-05-03 IMAGING — US US ABDOMEN LIMITED RUQ/ASCITES
1 series · 14 of 25 positions shown · non-contrast
Comparison: Abdominal ultrasound 12/02/2013

CLINICAL DATA: Abdominal pain for 1 week.

EXAM:
ULTRASOUND ABDOMEN LIMITED RIGHT UPPER QUADRANT

[Series 1: us abdomen limited ruq (liver/gb) · 14 of 56 slices shown]
[im 1/56]
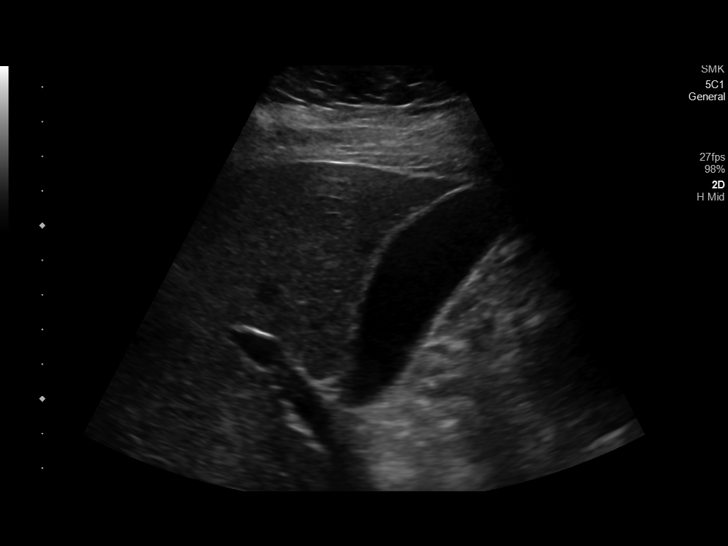
[im 5/56]
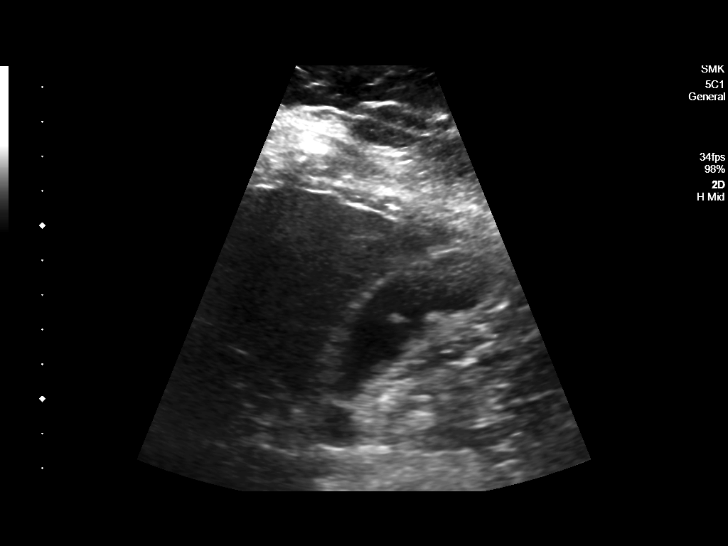
[im 10/56]
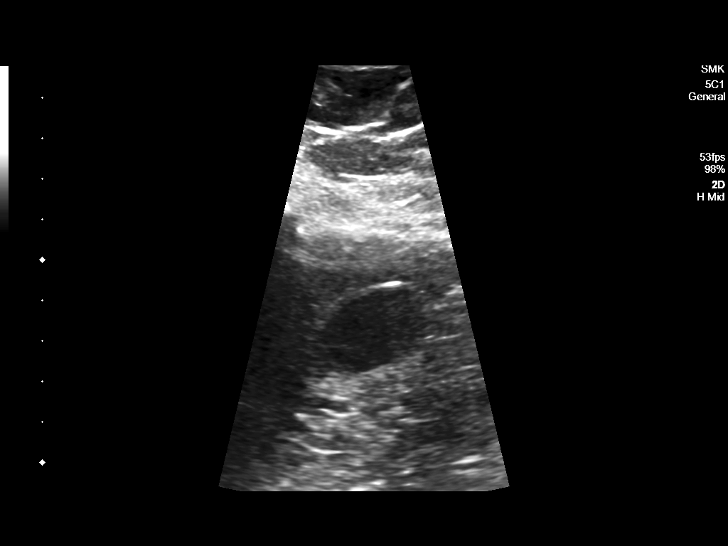
[im 14/56]
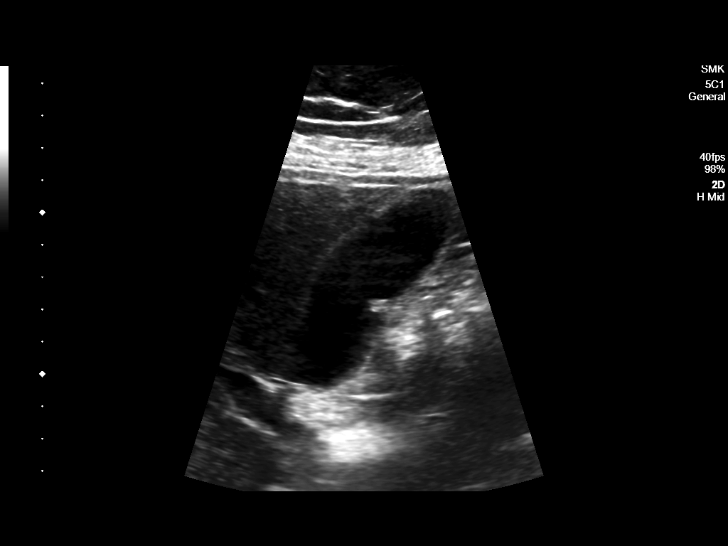
[im 19/56]
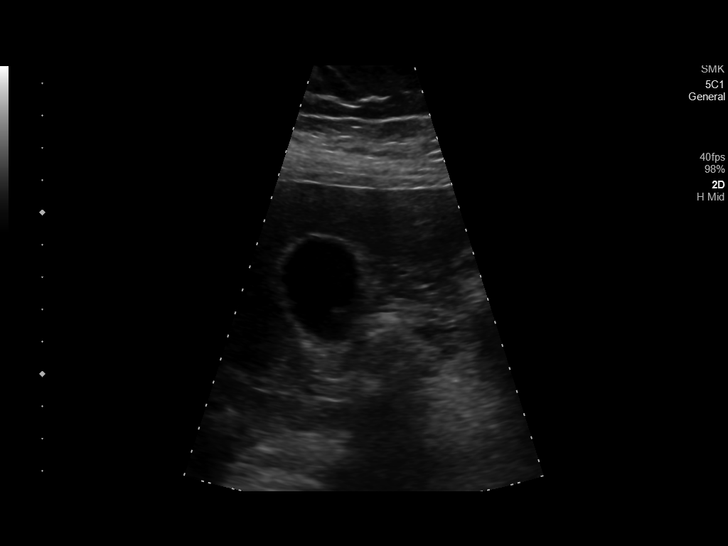
[im 21/56]
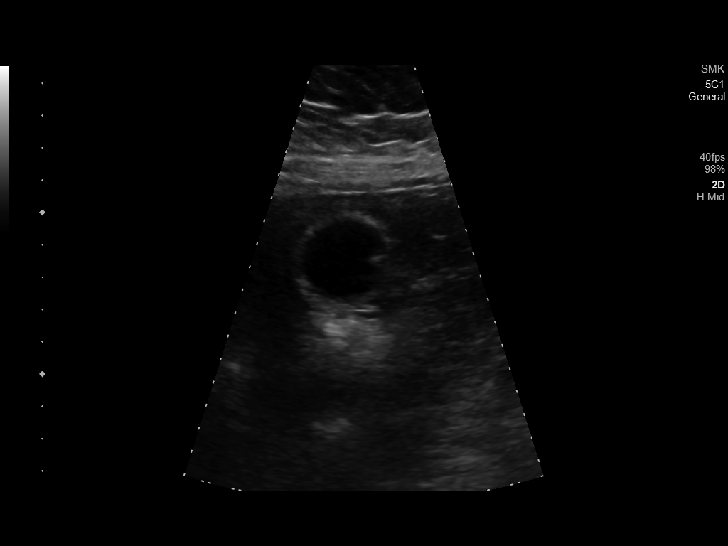
[im 26/56]
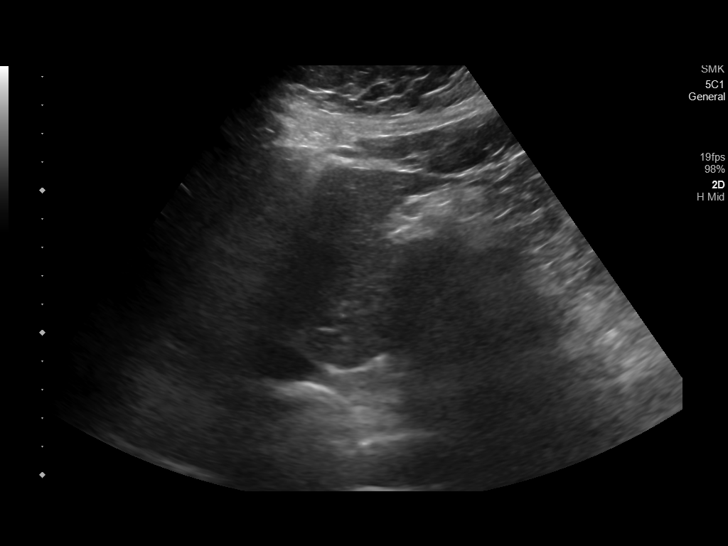
[im 30/56]
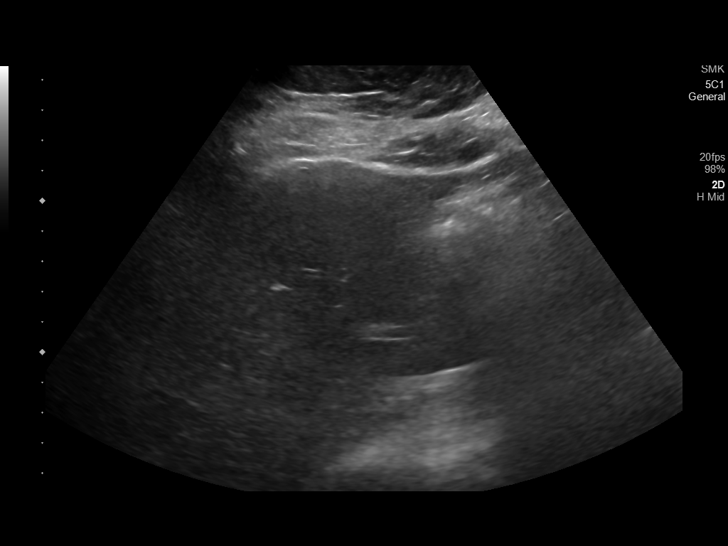
[im 35/56]
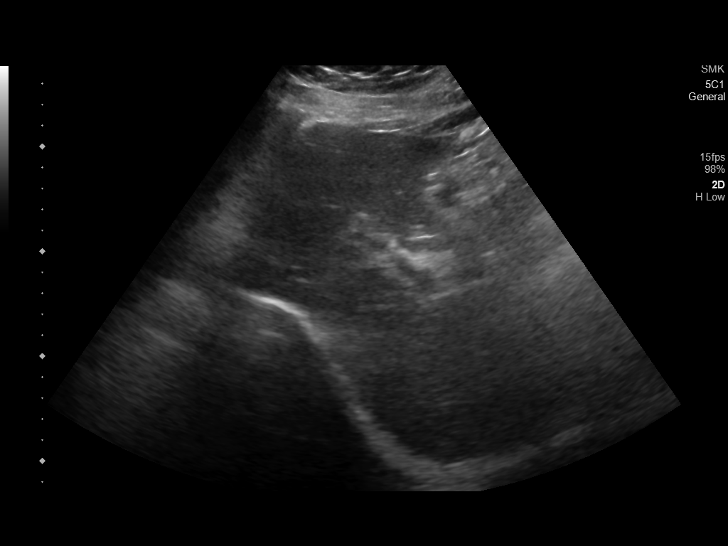
[im 37/56]
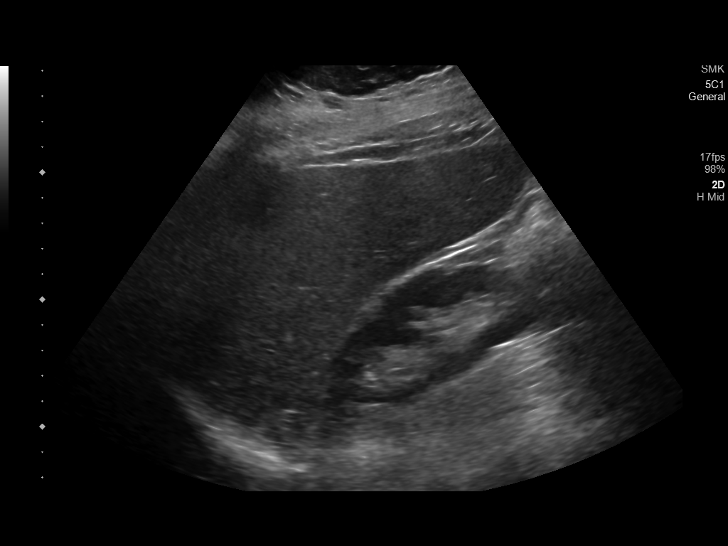
[im 42/56]
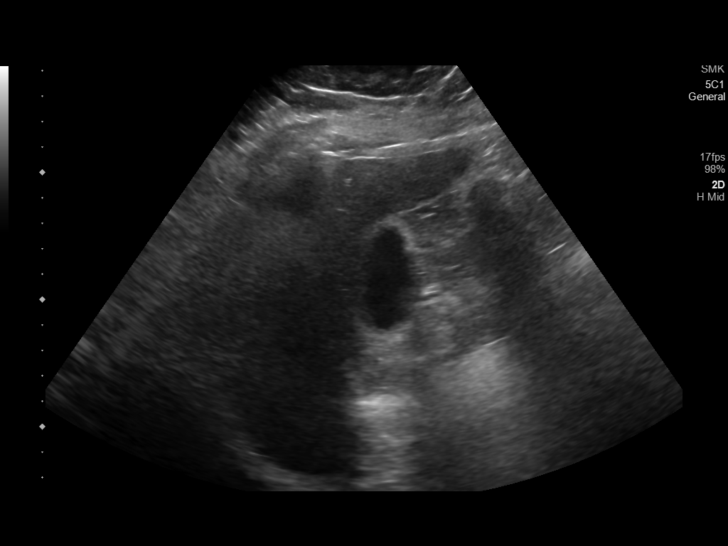
[im 46/56]
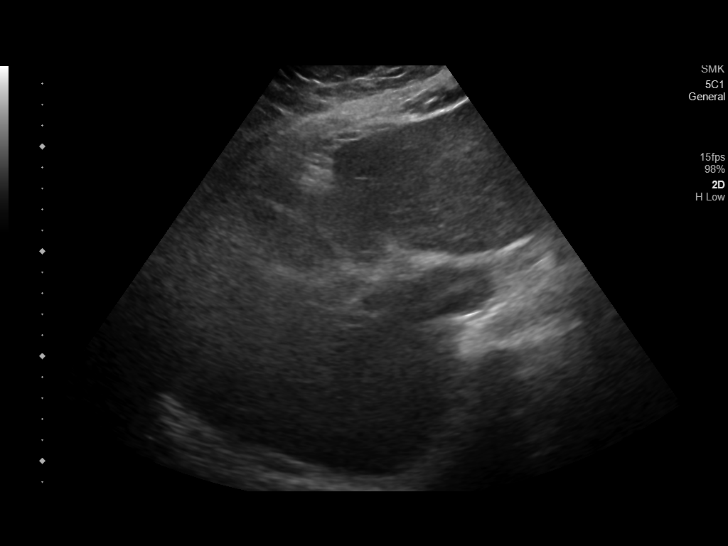
[im 51/56]
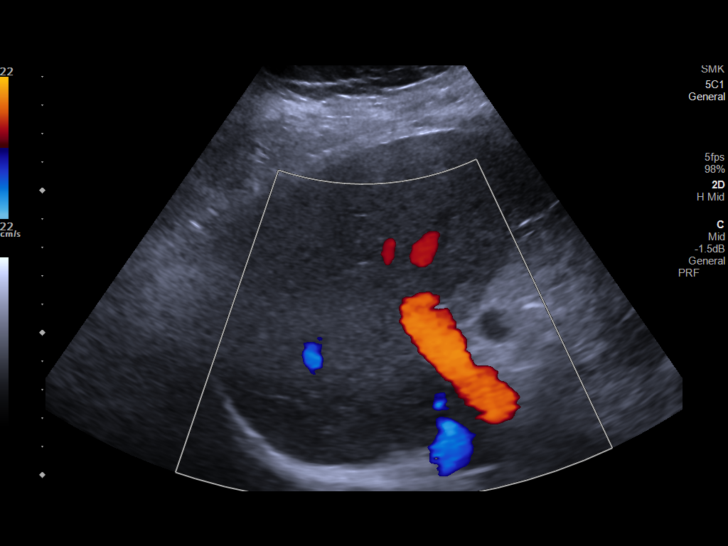
[im 56/56]
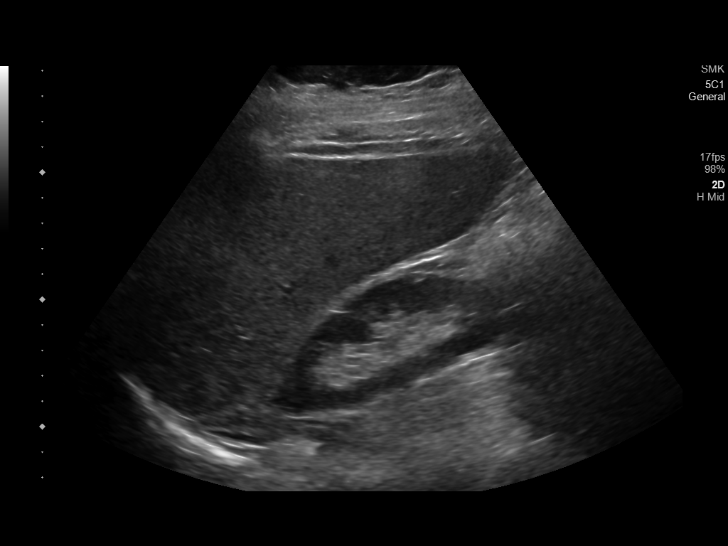

[14 of 25 positions shown; findings below may reference images not displayed]

FINDINGS: Gallbladder:

There is a small gallstone measuring 7 mm. No gallbladder wall
thickening. Negative sonographic Murphy sign.

Common bile duct:

Diameter: 0.2 cm, within normal limits

Liver:

No focal lesion identified. Parenchymal echogenicity is slightly
increased. Portal vein is patent on color Doppler imaging with
normal direction of blood flow towards the liver.

Other: None.
IMPRESSION: Is 1.  Cholelithiasis without evidence of cholecystitis.

2. Liver parenchymal echogenicity is slightly increased which is
nonspecific but most commonly seen with hepatic steatosis.

## 2021-09-29 NOTE — L&D Delivery Note (Signed)
Delivery Note 31 y.o. G2P1001 at [redacted]w[redacted]d who was admitted for IOL for ICP and gHTN.   At 11:45 AM a viable female was delivered via Vaginal, Spontaneous (Presentation: Left Occiput Anterior).  APGAR: 9, 9; weight 6 lb 11.6 oz (3050 g).   Placenta status: Spontaneous, Intact.  Cord: 3 vessels with the following complications: None.  Cord pH: not collected  Anesthesia: Local Episiotomy: None Lacerations: 2nd degree;Perineal Suture Repair: 3.0 Est. Blood Loss (mL): 458  Mom to postpartum.  Baby to Couplet care / Skin to Skin.  Sheppard Evens MD MPH OB Fellow, Faculty Practice [facility], [facility] 09/17/2022

## 2022-02-03 ENCOUNTER — Other Ambulatory Visit: Payer: Self-pay | Admitting: *Deleted

## 2022-02-03 MED ORDER — PROMETHAZINE HCL 25 MG PO TABS: 25.0000 mg | ORAL_TABLET | Freq: Four times a day (QID) | ORAL | 2 refills | Status: DC | PRN

## 2022-02-03 MED ORDER — DOXYLAMINE-PYRIDOXINE 10-10 MG PO TBEC: 2.0000 | DELAYED_RELEASE_TABLET | Freq: Every day | ORAL | 5 refills | Status: DC

## 2022-03-03 ENCOUNTER — Ambulatory Visit (INDEPENDENT_AMBULATORY_CARE_PROVIDER_SITE_OTHER): Payer: 59 | Admitting: *Deleted

## 2022-03-03 ENCOUNTER — Ambulatory Visit (INDEPENDENT_AMBULATORY_CARE_PROVIDER_SITE_OTHER): Payer: 59

## 2022-03-03 VITALS — BP 132/78 | HR 87 | Wt 187.0 lb

## 2022-03-03 DIAGNOSIS — Z3480 Encounter for supervision of other normal pregnancy, unspecified trimester: Secondary | ICD-10-CM

## 2022-03-03 DIAGNOSIS — O3680X0 Pregnancy with inconclusive fetal viability, not applicable or unspecified: Secondary | ICD-10-CM

## 2022-03-03 DIAGNOSIS — O0990 Supervision of high risk pregnancy, unspecified, unspecified trimester: Secondary | ICD-10-CM | POA: Insufficient documentation

## 2022-03-03 DIAGNOSIS — O09291 Supervision of pregnancy with other poor reproductive or obstetric history, first trimester: Secondary | ICD-10-CM

## 2022-03-03 DIAGNOSIS — Z8632 Personal history of gestational diabetes: Secondary | ICD-10-CM | POA: Insufficient documentation

## 2022-03-03 NOTE — Progress Notes (Cosign Needed)
New OB Intake  I explained I am completing New OB Intake today. We discussed her EDD of 10/08/22 that is based on LMP of 01/01/22. Pt is G2/P1. I reviewed her allergies, medications, Medical/Surgical/OB history, and appropriate screenings.    Patient Active Problem List   Diagnosis Date Noted   History of gestational diabetes mellitus (GDM) in prior pregnancy, currently pregnant in first trimester 03/03/2022   Supervision of other normal pregnancy, antepartum 03/03/2022    Concerns addressed today  Delivery Plans:  Plans to deliver at [facility].   Blood Pressure Cuff  Pt will check to see if she has one at home.    Anatomy US Explained first scheduled [place] will be around 19 weeks.   Labs Discussed Avelina Laine genetic screening with patient.  Routine prenatal labs needed.   Placed OB Box on problem list and updated   Patient informed that the ultrasound is considered a limited obstetric ultrasound and is not intended to be a complete ultrasound exam.  Patient also informed that the ultrasound is not being completed with the intent of assessing for fetal or placental anomalies or any pelvic abnormalities. Explained that the purpose of today's ultrasound is to assess for dating and fetal heart rate.  Patient acknowledges the purpose of the exam and the limitations of the study.      First visit review I reviewed new OB appt with pt. I explained she will have ob bloodwork and will think about genetic screening. Explained pt will be seen by Thalia Bloodgood CNM at first visit.    Scheryl Marten, RN 03/03/2022  10:04 AM

## 2022-03-27 ENCOUNTER — Ambulatory Visit (INDEPENDENT_AMBULATORY_CARE_PROVIDER_SITE_OTHER): Payer: 59 | Admitting: Advanced Practice Midwife

## 2022-03-27 ENCOUNTER — Encounter: Payer: Self-pay | Admitting: Advanced Practice Midwife

## 2022-03-27 ENCOUNTER — Other Ambulatory Visit (HOSPITAL_COMMUNITY)
Admission: RE | Admit: 2022-03-27 | Discharge: 2022-03-27 | Disposition: A | Discharge status: Home / Self Care | Payer: 59 | Source: Ambulatory Visit | Attending: Advanced Practice Midwife | Admitting: Advanced Practice Midwife

## 2022-03-27 VITALS — BP 147/83 | HR 78 | Wt 191.0 lb

## 2022-03-27 DIAGNOSIS — Z3480 Encounter for supervision of other normal pregnancy, unspecified trimester: Secondary | ICD-10-CM

## 2022-03-27 DIAGNOSIS — Z6832 Body mass index (BMI) 32.0-32.9, adult: Secondary | ICD-10-CM

## 2022-03-27 DIAGNOSIS — Z3A12 12 weeks gestation of pregnancy: Secondary | ICD-10-CM

## 2022-03-27 DIAGNOSIS — K5900 Constipation, unspecified: Secondary | ICD-10-CM

## 2022-03-27 DIAGNOSIS — O09291 Supervision of pregnancy with other poor reproductive or obstetric history, first trimester: Secondary | ICD-10-CM

## 2022-03-27 DIAGNOSIS — R030 Elevated blood-pressure reading, without diagnosis of hypertension: Secondary | ICD-10-CM

## 2022-03-27 DIAGNOSIS — Z8632 Personal history of gestational diabetes: Secondary | ICD-10-CM

## 2022-03-27 MED ORDER — DOCUSATE SODIUM 100 MG PO CAPS: 100.0000 mg | ORAL_CAPSULE | Freq: Two times a day (BID) | ORAL | 2 refills | Status: DC | PRN

## 2022-03-27 MED ORDER — POLYETHYLENE GLYCOL 3350 17 G PO PACK: 17.0000 g | PACK | Freq: Every day | ORAL | 0 refills | Status: DC

## 2022-03-27 NOTE — Progress Notes (Signed)
History:   Robin Salas is a 31 y.o. G2P1001 at [redacted]w[redacted]d by 8 week ultrasound c/w LMP. She is being seen today for her first obstetrical visit.  Her obstetrical history is significant for obesity and gestational diabetes (A1) . Patient does intend to breast feed. Pregnancy history fully reviewed.  Patient reports  constipation and hemorrhoids. This is a recurrent problem for her but she states she is already feeling more discomfort than she did in her previous pregnancy.  She reports high water intake. She tries to avoid fried foods. She stays active chasing her son Robin Salas.   Patient reports lengthy history of white coat hypertension. She states her bp at home is 120s/70s.  Patient is supported by her husband Robin Salas. This is a planned pregnancy.      HISTORY: OB History  Gravida Para Term Preterm AB Living  2 1 1  0 0 1  SAB IAB Ectopic Multiple Live Births  0 0 0 0 1    # Outcome Date GA Lbr Len/2nd Weight Sex Delivery Anes PTL Lv  2 Current           1 Term 04/06/20 [redacted]w[redacted]d 04:07 / 05:20 7 lb 9.7 oz (3.45 kg) M Vag-Vacuum EPI  LIV     Name: Robin Salas     Apgar1: 8  Apgar5: 9    Last pap smear was done in 2019 (patient estimate) and was normal  Past Medical History:  Diagnosis Date   Abnormal glucose tolerance test (GTT) during pregnancy, antepartum 12/24/2019   Acid reflux 11/27/2020   Acute blood loss anemia 04/07/2020   Allergic rhinitis 03/01/2015   Anemia    Fracture, pathologic, vertebra    last 4 vertabra   Hyperhidrosis, focal, primary 01/22/2011   Hyperhydrosis disorder    PCOS (polycystic ovarian syndrome), likely 11/22/2012   Postpartum anxiety 04/08/2020   Status post laparoscopic cholecystectomy 01/01/2021   Robotic assisted, on December 12, 2020   Wedge deformity on x-ray of spine    last 4 vertebra of spine   Past Surgical History:  Procedure Laterality Date   CHOLECYSTECTOMY     WISDOM TOOTH EXTRACTION     Family History  Problem Relation Age of Onset    Prostate cancer Maternal Grandfather    Bone cancer Maternal Grandfather    Breast cancer Maternal Aunt    Social History   Tobacco Use   Smoking status: Never   Smokeless tobacco: Never  Vaping Use   Vaping Use: Never used  Substance Use Topics   Alcohol use: No   Drug use: No   Allergies  Allergen Reactions   Amoxicillin Hives   Penicillins Hives   Current Outpatient Medications on File Prior to Visit  Medication Sig Dispense Refill   ondansetron (ZOFRAN-ODT) 4 MG disintegrating tablet Take 4 mg by mouth every 8 (eight) hours as needed for nausea or vomiting.     Prenatal Vit-Fe Fumarate-FA (MULTIVITAMIN-PRENATAL) 27-0.8 MG TABS tablet Take 1 tablet by mouth daily at 12 noon.     promethazine (PHENERGAN) 25 MG tablet Take 1 tablet (25 mg total) by mouth every 6 (six) hours as needed for nausea or vomiting. 30 tablet 2   Doxylamine-Pyridoxine (DICLEGIS) 10-10 MG TBEC Take 2 tablets by mouth at bedtime. If symptoms persist, add one tablet in the morning and one in the afternoon 100 tablet 5   esomeprazole (NEXIUM) 20 MG capsule Take 20 mg by mouth daily at 12 noon.     loratadine (CLARITIN)  10 MG tablet Take 10 mg by mouth daily as needed for allergies.     NEXPLANON 68 MG IMPL implant      sertraline (ZOLOFT) 50 MG tablet Take 50 mg by mouth daily.     No current facility-administered medications on file prior to visit.    Review of Systems Pertinent items noted in HPI and remainder of comprehensive ROS otherwise negative. Physical Exam:   Vitals:   03/27/22 1019  BP: (!) 147/83  Pulse: 78  Weight: 191 lb (86.6 kg)   Fetal Heart Rate (bpm): 161 Uterus:     System: General: well-developed, well-nourished female in no acute distress   Skin: normal coloration and turgor, no rashes   Neurologic: oriented, normal, negative, normal mood   Extremities: normal strength, tone, and muscle mass, ROM of all joints is normal   HEENT PERRLA, extraocular movement intact and sclera  clear, anicteric   Mouth/Teeth mucous membranes moist, pharynx normal without lesions and dental hygiene good   Neck supple and no masses   Cardiovascular: regular rate and rhythm   Respiratory:  no respiratory distress, normal breath sounds   Abdomen: soft, non-tender; bowel sounds normal; no masses,  no organomegaly     Assessment:    Pregnancy: G2P1001 Patient Active Problem List   Diagnosis Date Noted   History of gestational diabetes mellitus (GDM) in prior pregnancy, currently pregnant in first trimester 03/03/2022   Supervision of other normal pregnancy, antepartum 03/03/2022      Indications for ASA therapy (per uptodate) One of the following: Previous pregnancy with preeclampsia, especially early onset and with an adverse outcome No Multifetal gestation No Chronic hypertension Uncertain. Will continue to assess with next visit and home pressures Type 1 or 2 diabetes mellitus No Chronic kidney disease No Autoimmune disease (antiphospholipid syndrome, systemic lupus erythematosus) No  Two or more of the following: Nulliparity No Obesity (body mass index >30 kg/m2) Yes Family history of preeclampsia in mother or sister No Age ?35 years No Sociodemographic characteristics (African American race, low socioeconomic level) No Personal risk factors (eg, previous pregnancy with low birth weight or small for gestational age infant, previous adverse pregnancy outcome [eg, stillbirth], interval >10 years between pregnancies) No  Plan:    1. Supervision of other normal pregnancy, antepartum - Welcome to practice!  - CBC/D/Plt+RPR+Rh+ABO+RubIgG... - Culture, OB Urine - Cervicovaginal ancillary only - Hemoglobin A1c - TSH - [place] MFM OB DETAIL +14 WK; Future - PANORAMA PRENATAL TEST FULL PANEL - Comprehensive metabolic panel  2. Constipation, unspecified constipation type - Continue excellent hydration with water - Advised 30 minutes of walking in the morning and before  bedtime as summer temperature allows - polyethylene glycol (MIRALAX) 17 g packet; Take 17 g by mouth daily.  Dispense: 14 each; Refill: 0 - docusate sodium (COLACE) 100 MG capsule; Take 1 capsule (100 mg total) by mouth 2 (two) times daily as needed.  Dispense: 30 capsule; Refill: 2  3. [redacted] weeks gestation of pregnancy   4. BMI 32.0-32.9,adult - Baseline labs  5. History of gestational diabetes in prior pregnancy, currently pregnant in first trimester - Baseline HgbA1C  6. White coat syndrome with high blood pressure without hypertension - Prenatal records from [facility] reviewed. Patient with intermittent elevated bp at initial visit then normotensive - Discussed ongoing assessment vs Chronic HTN without medication.  - Patient prefers ongoing surveillance and will collect bp on home cuff   Initial labs drawn. Continue prenatal vitamins. Genetic Screening discussed, First trimester  screen, Quad screen, and NIPS: ordered. Ultrasound discussed; fetal anatomic survey: ordered. Problem list reviewed and updated. The nature of [facility] with multiple MDs and other Advanced Practice Providers was explained to patient; also emphasized that residents, students are part of our team. Routine obstetric precautions reviewed. Return in about 4 weeks (around 04/24/2022) for MD or APP please.     Clayton Bibles, MSN, CNM Certified Nurse Midwife, [employer] for [employer], [facility] 03/27/22 1:02 PM

## 2022-03-28 ENCOUNTER — Encounter: Payer: Self-pay | Admitting: Advanced Practice Midwife

## 2022-03-28 DIAGNOSIS — Z2839 Other underimmunization status: Secondary | ICD-10-CM | POA: Insufficient documentation

## 2022-03-28 LAB — CBC/D/PLT+RPR+RH+ABO+RUBIGG...
Antibody Screen: NEGATIVE
Basophils Absolute: 0 10*3/uL (ref 0.0–0.2)
Basos: 0 %
EOS (ABSOLUTE): 0.1 10*3/uL (ref 0.0–0.4)
Eos: 1 %
HCV Ab: NONREACTIVE
HIV Screen 4th Generation wRfx: NONREACTIVE
Hematocrit: 35.5 % (ref 34.0–46.6)
Hemoglobin: 11.1 g/dL (ref 11.1–15.9)
Hepatitis B Surface Ag: NEGATIVE
Immature Grans (Abs): 0 10*3/uL (ref 0.0–0.1)
Immature Granulocytes: 0 %
Lymphocytes Absolute: 1.8 10*3/uL (ref 0.7–3.1)
Lymphs: 22 %
MCH: 22.5 pg — ABNORMAL LOW (ref 26.6–33.0)
MCHC: 31.3 g/dL — ABNORMAL LOW (ref 31.5–35.7)
MCV: 72 fL — ABNORMAL LOW (ref 79–97)
Monocytes Absolute: 0.5 10*3/uL (ref 0.1–0.9)
Monocytes: 6 %
Neutrophils Absolute: 5.8 10*3/uL (ref 1.4–7.0)
Neutrophils: 71 %
Platelets: 193 10*3/uL (ref 150–450)
RBC: 4.94 x10E6/uL (ref 3.77–5.28)
RDW: 15.8 % — ABNORMAL HIGH (ref 11.7–15.4)
RPR Ser Ql: NONREACTIVE
Rh Factor: POSITIVE
Rubella Antibodies, IGG: <0.9 index — ABNORMAL LOW (ref >0.99)
WBC: 8.3 10*3/uL (ref 3.4–10.8)

## 2022-03-28 LAB — COMPREHENSIVE METABOLIC PANEL
ALT: 14 IU/L (ref 0–32)
AST: 12 IU/L (ref 0–40)
Albumin/Globulin Ratio: 1.6 (ref 1.2–2.2)
Albumin: 4.1 g/dL (ref 3.9–5.0)
Alkaline Phosphatase: 77 IU/L (ref 44–121)
BUN/Creatinine Ratio: 12 (ref 9–23)
BUN: 8 mg/dL (ref 6–20)
Bilirubin Total: 0.5 mg/dL (ref 0.0–1.2)
CO2: 19 mmol/L — ABNORMAL LOW (ref 20–29)
Calcium: 9.1 mg/dL (ref 8.7–10.2)
Chloride: 101 mmol/L (ref 96–106)
Creatinine, Ser: 0.69 mg/dL (ref 0.57–1.00)
Globulin, Total: 2.6 g/dL (ref 1.5–4.5)
Glucose: 92 mg/dL (ref 70–99)
Potassium: 4 mmol/L (ref 3.5–5.2)
Sodium: 133 mmol/L — ABNORMAL LOW (ref 134–144)
Total Protein: 6.7 g/dL (ref 6.0–8.5)
eGFR: 120 mL/min/{1.73_m2} (ref >59)

## 2022-03-28 LAB — CERVICOVAGINAL ANCILLARY ONLY
Chlamydia: NEGATIVE
Comment: NEGATIVE
Comment: NORMAL
Neisseria Gonorrhea: NEGATIVE

## 2022-03-28 LAB — HCV INTERPRETATION

## 2022-03-28 LAB — TSH: TSH: 0.733 u[IU]/mL (ref 0.450–4.500)

## 2022-03-28 LAB — HEMOGLOBIN A1C
Est. average glucose Bld gHb Est-mCnc: 108 mg/dL
Hgb A1c MFr Bld: 5.4 % (ref 4.8–5.6)

## 2022-03-29 LAB — URINE CULTURE, OB REFLEX

## 2022-03-29 LAB — CULTURE, OB URINE

## 2022-04-03 LAB — PANORAMA PRENATAL TEST FULL PANEL:PANORAMA TEST PLUS 5 ADDITIONAL MICRODELETIONS: FETAL FRACTION: 4

## 2022-04-29 ENCOUNTER — Telehealth (INDEPENDENT_AMBULATORY_CARE_PROVIDER_SITE_OTHER): Payer: 59 | Admitting: Obstetrics & Gynecology

## 2022-04-29 ENCOUNTER — Encounter: Payer: Self-pay | Admitting: Obstetrics & Gynecology

## 2022-04-29 VITALS — BP 123/75

## 2022-04-29 DIAGNOSIS — Z3482 Encounter for supervision of other normal pregnancy, second trimester: Secondary | ICD-10-CM

## 2022-04-29 DIAGNOSIS — Z3A16 16 weeks gestation of pregnancy: Secondary | ICD-10-CM

## 2022-04-29 DIAGNOSIS — Z3480 Encounter for supervision of other normal pregnancy, unspecified trimester: Secondary | ICD-10-CM

## 2022-04-29 NOTE — Progress Notes (Signed)
   OBSTETRICS PRENATAL VIRTUAL VISIT ENCOUNTER NOTE  Provider location: [facility] for [facility]   Patient location: Home  I connected with Robin Salas on 04/29/22 at 10:35 AM EDT by MyChart Video Encounter and verified that I am speaking with the correct person using two identifiers. I discussed the limitations, risks, security and privacy concerns of performing an evaluation and management service virtually and the availability of in person appointments. I also discussed with the patient that there may be a patient responsible charge related to this service. The patient expressed understanding and agreed to proceed. Subjective:  Robin Salas is a 31 y.o. G2P1001 at [redacted]w[redacted]d being seen today for ongoing prenatal care.  She is currently monitored for the following issues for this low-risk pregnancy and has History of gestational diabetes mellitus (GDM) in prior pregnancy, currently pregnant in first trimester; Supervision of other normal pregnancy, antepartum; and Rubella non-immune status, antepartum on their problem list.  Patient reports no complaints.  Had some nausea last week , improved now. Contractions: Not present. Vag. Bleeding: None.  Movement: Present. Denies any leaking of fluid.   The following portions of the patient's history were reviewed and updated as appropriate: allergies, current medications, past family history, past medical history, past social history, past surgical history and problem list.   Objective:   Vitals:   04/29/22 1011  BP: 123/75  Of note, last BP at 12 weeks was 147/83. Patient reports 120s/70s at home, no elevated values at home.  Fetal Status:     Movement: Present     General:  Alert, oriented and cooperative. Patient is in no acute distress.  Respiratory: Normal respiratory effort, no problems with respiration noted  Mental Status: Normal mood and affect. Normal behavior. Normal judgment and thought content.  Rest of  physical exam deferred due to type of encounter  Assessment and Plan:  Pregnancy: G2P1001 at [redacted]w[redacted]d 1. [redacted] weeks gestation of pregnancy 2. Supervision of other normal pregnancy, antepartum Low risk NIPS, discussed AFP, will be offered next visit.  No other complaints or concerns.  Routine obstetric precautions reviewed.  I discussed the assessment and treatment plan with the patient. The patient was provided an opportunity to ask questions and all were answered. The patient agreed with the plan and demonstrated an understanding of the instructions. The patient was advised to call back or seek an in-person office evaluation/go to MAU at [facility] for any urgent or concerning symptoms. Please refer to After Visit Summary for other counseling recommendations.   I provided 7 minutes of face-to-face time during this encounter.  Return in about 4 weeks (around 05/27/2022) for OFFICE OB VISIT (MD only).  Future Appointments  Date Time Provider Department Center  04/29/2022 10:35 AM Wilma Michaelson, Jethro Bastos, MD [facility]  05/14/2022  9:45 AM [facility] US4 [facility]  05/27/2022 11:15 AM Abra Lingenfelter, Jethro Bastos, MD [facility]  06/24/2022 11:15 AM Milas Hock, MD [facility]  07/22/2022  8:00 AM [facility] LAB [facility]  07/22/2022  8:35 AM Constant, Gigi Gin, MD [facility]    Jaynie Collins, MD Center for [facility], [facility]

## 2022-05-14 ENCOUNTER — Other Ambulatory Visit: Payer: Self-pay | Admitting: *Deleted

## 2022-05-14 ENCOUNTER — Ambulatory Visit: Payer: 59 | Attending: Advanced Practice Midwife

## 2022-05-14 ENCOUNTER — Encounter: Payer: Self-pay | Admitting: *Deleted

## 2022-05-14 ENCOUNTER — Ambulatory Visit: Payer: 59 | Admitting: *Deleted

## 2022-05-14 VITALS — BP 128/75 | HR 89

## 2022-05-14 DIAGNOSIS — O09292 Supervision of pregnancy with other poor reproductive or obstetric history, second trimester: Secondary | ICD-10-CM | POA: Diagnosis not present

## 2022-05-14 DIAGNOSIS — Z363 Encounter for antenatal screening for malformations: Secondary | ICD-10-CM | POA: Diagnosis not present

## 2022-05-14 DIAGNOSIS — Z3A19 19 weeks gestation of pregnancy: Secondary | ICD-10-CM | POA: Diagnosis not present

## 2022-05-14 DIAGNOSIS — Z3480 Encounter for supervision of other normal pregnancy, unspecified trimester: Secondary | ICD-10-CM | POA: Insufficient documentation

## 2022-05-14 DIAGNOSIS — E669 Obesity, unspecified: Secondary | ICD-10-CM | POA: Insufficient documentation

## 2022-05-14 DIAGNOSIS — Z3689 Encounter for other specified antenatal screening: Secondary | ICD-10-CM

## 2022-05-14 DIAGNOSIS — O24112 Pre-existing diabetes mellitus, type 2, in pregnancy, second trimester: Secondary | ICD-10-CM | POA: Insufficient documentation

## 2022-05-14 DIAGNOSIS — O99212 Obesity complicating pregnancy, second trimester: Secondary | ICD-10-CM | POA: Diagnosis not present

## 2022-05-14 DIAGNOSIS — O09299 Supervision of pregnancy with other poor reproductive or obstetric history, unspecified trimester: Secondary | ICD-10-CM

## 2022-05-14 DIAGNOSIS — Z6833 Body mass index (BMI) 33.0-33.9, adult: Secondary | ICD-10-CM

## 2022-05-16 ENCOUNTER — Encounter: Payer: Self-pay | Admitting: Advanced Practice Midwife

## 2022-05-27 ENCOUNTER — Encounter: Payer: Self-pay | Admitting: Obstetrics & Gynecology

## 2022-05-27 ENCOUNTER — Ambulatory Visit (INDEPENDENT_AMBULATORY_CARE_PROVIDER_SITE_OTHER): Payer: 59 | Admitting: Obstetrics & Gynecology

## 2022-05-27 VITALS — BP 132/85 | HR 89 | Wt 200.0 lb

## 2022-05-27 DIAGNOSIS — Z3A20 20 weeks gestation of pregnancy: Secondary | ICD-10-CM

## 2022-05-27 DIAGNOSIS — Z3482 Encounter for supervision of other normal pregnancy, second trimester: Secondary | ICD-10-CM

## 2022-05-27 DIAGNOSIS — Z3480 Encounter for supervision of other normal pregnancy, unspecified trimester: Secondary | ICD-10-CM

## 2022-05-27 NOTE — Patient Instructions (Signed)
Return to office for any scheduled appointments. Call the office or go to the MAU at [facility] if: You begin to have strong, frequent contractions Your water breaks.  Sometimes it is a big gush of fluid, sometimes it is just a trickle that keeps getting your underwear wet or running down your legs You have vaginal bleeding.  It is normal to have a small amount of spotting if your cervix was checked.  You do not feel your baby moving like normal.  If you do not, get something to eat and drink and lay down and focus on feeling your baby move.   If your baby is still not moving like normal, you should call the office or go to MAU. Any other obstetric concerns.    Oral Glucose Tolerance Test During Pregnancy Why am I having this test? The oral glucose tolerance test (OGTT) is done to check how your body processes blood sugar (glucose). This is one of several tests used to diagnose diabetes that develops during pregnancy (gestational diabetes mellitus). Gestational diabetes is a short-term form of diabetes that some women develop while they are pregnant. It usually occurs during the second or third trimester of pregnancy and goes away after delivery. Testing, or screening, for gestational diabetes usually occurs around 25 of pregnancy. This test may also be needed if: You have a history of gestational diabetes. There is a history of giving birth to very large babies or of losing pregnancies (having stillbirths). You have signs and symptoms of diabetes, such as: Changes in your eyesight. Tingling or numbness in your hands or feet. Changes in hunger, thirst, and urination, and these are not explained by your pregnancy. What is being tested? This test measures the amount of glucose in your blood at different times during a period of 2 hours. This shows how well your body can process glucose. What kind of sample is taken?  Blood samples are required for this test. They  are usually collected by inserting a needle into a blood vessel. How do I prepare for this test? For 3 days before your test, eat normally. Have plenty of carbohydrate-rich foods. Follow instructions from your health care provider about: Eating or drinking restrictions on the day of the test. You may be asked not to eat or drink anything other than water (to fast) starting 8-10 hours before the test. Changing or stopping your regular medicines. Some medicines may interfere with this test. Tell a health care provider about: All medicines you are taking, including vitamins, herbs, eye drops, creams, and over-the-counter medicines. Any blood disorders you have. Any surgeries you have had. Any medical conditions you have. What happens during the test? First, your blood glucose will be measured. This is referred to as your fasting blood glucose because you fasted before the test. Then, you will drink a glucose solution that contains a certain amount of glucose. Your blood glucose will be measured again 1 and 2 hours after you drink the solution. This test takes about 2 hours to complete. You will need to stay at the testing location during this time. During the testing period: Do not eat or drink anything other than the glucose solution. Do not exercise. Do not use any products that contain nicotine or tobacco, such as cigarettes, e-cigarettes, and chewing tobacco. These can affect your test results. If you need help quitting, ask your health care provider. The testing procedure may vary among health care providers and hospitals. How  are the results reported? Your results will be reported as milligrams of glucose per deciliter of blood (mg/dL) or millimoles per liter (mmol/L). There is more than one source for screening and diagnosis reference values used to diagnose gestational diabetes. Your health care provider will compare your results to normal values that were established after testing a large  group of people (reference values). Reference values may vary among labs and hospitals. For this test (Carpenter-Coustan), reference values are: Fasting: 92 mg/dL 1 hour: 528 mg/dL  2 hour: 413 mg/dL   What do the results mean? Results below the reference values are considered normal. If one or more of your blood glucose levels are at or above the reference values, you may be diagnosed with gestational diabetes.  Talk with your health care provider about what your results mean. Questions to ask your health care provider Ask your health care provider, or the department that is doing the test: When will my results be ready? How will I get my results? What are my treatment options? What other tests do I need? What are my next steps? Summary The oral glucose tolerance test (OGTT) is one of several tests used to diagnose diabetes that develops during pregnancy (gestational diabetes mellitus). Gestational diabetes is a short-term form of diabetes that some women develop while they are pregnant. You may also have this test if you have any symptoms or risk factors for this type of diabetes. Talk with your health care provider about what your results mean. This information is not intended to replace advice given to you by your health care provider. Make sure you discuss any questions you have with your health care provider.

## 2022-05-27 NOTE — Progress Notes (Signed)
PRENATAL VISIT NOTE  Subjective:  Robin Salas is a 31 y.o. G2P1001 at [redacted]w[redacted]d being seen today for ongoing prenatal care.  She is currently monitored for the following issues for this high-risk pregnancy and has History of gestational diabetes mellitus (GDM) in prior pregnancy, currently pregnant in first trimester; Supervision of other normal pregnancy, antepartum; and Rubella non-immune status, antepartum on their problem list.  Patient reports no complaints.  Contractions: Not present. Vag. Bleeding: None.  Movement: Present. Denies leaking of fluid.   The following portions of the patient's history were reviewed and updated as appropriate: allergies, current medications, past family history, past medical history, past social history, past surgical history and problem list.   Objective:   Vitals:   05/27/22 1111  BP: 132/85  Pulse: 89  Weight: 200 lb (90.7 kg)    Fetal Status: Fetal Heart Rate (bpm): 150   Movement: Present     General:  Alert, oriented and cooperative. Patient is in no acute distress.  Skin: Skin is warm and dry. No rash noted.   Cardiovascular: Normal heart rate noted  Respiratory: Normal respiratory effort, no problems with respiration noted  Abdomen: Soft, gravid, appropriate for gestational age.  Pain/Pressure: Absent     Pelvic: Cervical exam deferred        Extremities: Normal range of motion.  Edema: None  Mental Status: Normal mood and affect. Normal behavior. Normal judgment and thought content.   [place] MFM OB DETAIL +14 WK  Result Date: 05/14/2022 ----------------------------------------------------------------------  OBSTETRICS REPORT                       (Signed Final 05/14/2022 12:49 pm) ---------------------------------------------------------------------- Patient Info  ID #:       [id number]                          D.O.B.:  May 05, 1991 (31 yrs)  Name:       Robin Salas               Visit Date: 05/14/2022 09:25 am  ---------------------------------------------------------------------- Performed By  Attending:        Tama High MD        Ref. Address:     [place]  Performed By:     Marye Round Pharisien     [facility]  Referred By:      [facility] ---------------------------------------------------------------------- Orders  #  Description                           Code        Ordered By  1  [place] MFM OB DETAIL +14 WK               62947.65    Clayton Bibles ----------------------------------------------------------------------  #  Order #                     Accession #                Episode #  1  [id number]                   [id number]                 [id number] ---------------------------------------------------------------------- Indications  Obesity complicating pregnancy, second         O99.212  trimester (BMI 33)  Echogenic intracardiac focus of the heart      O35.8XX0  (EIF)  Poor obstetric history: Previous gestational   O09.299  diabetes  LR NIPS  [redacted] weeks gestation of pregnancy                Z3A.19  Encounter for antenatal screening for          Z36.3  malformations ---------------------------------------------------------------------- Vital Signs                            Pulse:  89  BP:          128/75 ---------------------------------------------------------------------- Fetal Evaluation  Num Of Fetuses:         1  Fetal Heart Rate(bpm):  150  Cardiac Activity:       Observed  Presentation:           Cephalic  Placenta:               Posterior  P. Cord Insertion:      Visualized, central  Amniotic Fluid  AFI FV:      Within normal limits                               Largest Pocket(cm)                              3.33 ---------------------------------------------------------------------- Biometry  BPD:     42.46  mm     G. Age:  18w 6d         44  %  CI:        73.92   %    70 - 86                                                          FL/HC:      17.7   %    16.1 - 18.3  HC:    156.84   mm     G. Age:  18w 4d         23  %    HC/AC:      1.14        1.09 - 1.39  AC:      137.3  mm     G. Age:  19w 1d         51  %    FL/BPD:     65.5   %  FL:      27.82  mm     G. Age:  18w 4d         26  %    FL/AC:      20.3   %    20 - 24  HUM:      26.5  mm     G. Age:  18w 2d         33  %  CER:      19.5  mm     G. Age:  19w 0d         33  %  NFT:       4.7  mm  LV:        4.1  mm  CM:        5.9  mm  Est. FW:     260  gm      0 lb 9 oz     36  % ---------------------------------------------------------------------- OB History  Blood Type:   A+  Gravidity:    2         Term:   1        Prem:   0        SAB:   0  TOP:          0       Ectopic:  0        Living: 1 ---------------------------------------------------------------------- Gestational Age  LMP:           19w 0d        Date:  01/01/22                   EDD:   10/08/22  U/S Today:     18w 6d                                        EDD:   10/09/22  Best:          19w 0d     Det. By:  LMP  (01/01/22)          EDD:   10/08/22 ---------------------------------------------------------------------- Anatomy  Cranium:               Appears normal         Aortic Arch:  Appears normal  Cavum:                 Appears normal         Ductal Arch:            Appears normal  Ventricles:            Appears normal         Diaphragm:              Appears normal  Choroid Plexus:        Appears normal         Stomach:                Appears normal, left                                                                        sided  Cerebellum:            Appears normal         Abdomen:                Appears normal   Posterior Fossa:       Appears normal         Abdominal Wall:         Appears nml (cord                                                                        insert, abd wall)  Nuchal Fold:           Appears normal         Cord Vessels:           Appears normal (3                                                                        vessel cord)  Face:                  Appears normal         Kidneys:                Appear normal                         (orbits and profile)  Lips:                  Appears normal         Bladder:                Appears normal  Thoracic:              Appears normal  Spine:                  Appears normal  Heart:                 Appears normal; EIF    Upper Extremities:      Appears normal  RVOT:                  Appears normal         Lower Extremities:      Appears normal  LVOT:                  Appears normal  Other:  Fetus appears to be female. VC, 3VV and 3VTV visualized.          Heels/feet and open hands/5th digits visualized. Nasal bone, lenses,          maxilla, mandible and falx visualized ---------------------------------------------------------------------- Cervix Uterus Adnexa  Cervix  Length:            4.1  cm.  Normal appearance by transabdominal scan.  Uterus  Normal shape and size.  Right Ovary  Size(cm)     2.41   x   1.95   x  2.23      Vol(ml): 5.49  Within normal limits.  Left Ovary  Size(cm)     2.87   x   1.97   x  1.97      Vol(ml): 5.83  Within normal limits.  Cul De Sac  No free fluid seen.  Adnexa  No adnexal mass visualized. ---------------------------------------------------------------------- Impression  G2 P1.  Patient is here for fetal anatomy scan.  On cell-free fetal DNA screening, the risks of fetal  aneuploidies are not increased.  Obstetrical history is significant for a term vaginal delivery.  Her pregnancy was complicated by gestational diabetes.  Recent hemoglobin A1c was within normal range.  We performed fetal anatomy scan. An  echogenic intracardiac  focus is seen. No other makers of aneuploidies or fetal  structural defects are seen. Fetal biometry is consistent with  her previously-established dates. Amniotic fluid is normal and  good fetal activity is seen.  I informed the patient that given that she had low rik for fetal  aneuploidies on cell-free fetal DNA screening, finding of  echogenic intracardiac focus should be considered a normal  variant and that the risk of trisomy 21 is not increased. I also  reassured that echogenic focus does not increase the risk of  cardiac defects. I also informed her that only amniocentesis  will give a defintive result on the fetal karyotype. I did not  recommend amniocentesis for this finding.  Patient opted not to have amniocentesis. ---------------------------------------------------------------------- Recommendations  -An appointment was made for her to return in 12 weeks for  fetal growth assessment (history of GDM). ----------------------------------------------------------------------                 Tama High, MD Electronically Signed Final Report   05/14/2022 12:49 pm ----------------------------------------------------------------------   Assessment and Plan:  Pregnancy: G2P1001 at [redacted]w[redacted]d 1. [redacted] weeks gestation of pregnancy 2. Supervision of other normal pregnancy, antepartum AFP offered and declined.  No other concerns. Preterm labor symptoms and general obstetric precautions including but not limited to vaginal bleeding, contractions, leaking of fluid and fetal movement were reviewed in detail with the patient. Please refer to After Visit Summary for other counseling recommendations.   Return in about 4 weeks (around 06/24/2022) for OFFICE OB VISIT (MD  or APP).  Future Appointments  Date Time Provider [place]  06/24/2022 11:15 AM Radene Gunning, MD [facility]  07/22/2022  8:00 AM [facility] LAB [facility]  07/22/2022  8:35 AM Constant, Vickii Chafe, MD  [facility]  08/05/2022  9:35 AM Harolyn Rutherford, Sallyanne Havers, MD [facility]  08/06/2022  9:15 AM [facility] NURSE [facility]  08/06/2022  9:30 AM [facility] US3 [facility]  08/19/2022  9:35 AM Edmund Holcomb, Sallyanne Havers, MD [facility]    Verita Schneiders, MD

## 2022-06-19 NOTE — Progress Notes (Signed)
   OBSTETRICS PRENATAL VIRTUAL VISIT ENCOUNTER NOTE  Provider location: [facility] [place] [facility]   Patient location: Home  I connected with Robin Salas on 06/24/22 at 11:15 AM EDT by MyChart Video Encounter and verified that I am speaking with the correct person using two identifiers. I discussed the limitations, risks, security and privacy concerns of performing an evaluation and management service virtually and the availability of in person appointments. I also discussed with the patient that there may be a patient responsible charge related to this service. The patient expressed understanding and agreed to proceed. Subjective:  Robin Salas is a 31 y.o. G2P1001 at 28w6dbeing seen today for ongoing prenatal care.  She is currently monitored for the following issues for this low-risk pregnancy and has History of gestational diabetes mellitus (GDM) in prior pregnancy, currently pregnant in first trimester; Supervision of other normal pregnancy, antepartum; and Rubella non-immune status, antepartum on their problem list.  Patient reports no complaints.  Contractions: Not present. Vag. Bleeding: None.  Movement: Present. Denies any leaking of fluid.   The following portions of the patient's history were reviewed and updated as appropriate: allergies, current medications, past family history, past medical history, past social history, past surgical history and problem list.   Objective:  There were no vitals filed for this visit.  Fetal Status:     Movement: Present     General:  Alert, oriented and cooperative. Patient is in no acute distress.  Respiratory: Normal respiratory effort, no problems with respiration noted  Mental Status: Normal mood and affect. Normal behavior. Normal judgment and thought content.  Rest of physical exam deferred due to type of encounter  Imaging: No results found.  Assessment and Plan:  Pregnancy: G2P1001 at 258w6d. History  of gestational diabetes mellitus (GDM) in prior pregnancy, currently pregnant in first trimester A1C in first trimester was normal (5.4)  2. Supervision of other normal pregnancy, antepartum Offered and recommended flu shot - pt accepts for next time.  MOF: Plans to breast. Had issues with latch last time.  MOC: Condoms. Although if she has a c-section, she would like a tubal.  28 wk labs next time  3. Rubella non-immune status, antepartum MMR after delivery  Preterm labor symptoms and general obstetric precautions including but not limited to vaginal bleeding, contractions, leaking of fluid and fetal movement were reviewed in detail with the patient. I discussed the assessment and treatment plan with the patient. The patient was provided an opportunity to ask questions and all were answered. The patient agreed with the plan and demonstrated an understanding of the instructions. The patient was advised to call back or seek an in-person office evaluation/[facility] any urgent or concerning symptoms. Please refer to After Visit Summary for other counseling recommendations.   I provided 11 minutes of face-to-face time during this encounter.  Return in about 4 weeks (around 07/22/2022) for OB VISIT, MD or APP.  Future Appointments  Date Time Provider [place]10/24/2023  8:00 AM [facility] LAB [facility]  07/22/2022  8:35 AM Constant, PeVickii ChafeMD [facility]  08/05/2022  9:35 AM AnHarolyn RutherfordUgSallyanne HaversMD [facility]  08/06/2022  9:15 AM [facility]11/04/2022  9:30 AM [facility]/2023  9:35 AM Anyanwu, UgSallyanne HaversMD [facility]    PaRadene GunningMDDeshleror [facility]

## 2022-06-24 ENCOUNTER — Encounter: Payer: Self-pay | Admitting: Obstetrics and Gynecology

## 2022-06-24 ENCOUNTER — Telehealth (INDEPENDENT_AMBULATORY_CARE_PROVIDER_SITE_OTHER): Payer: 59 | Admitting: Obstetrics and Gynecology

## 2022-06-24 DIAGNOSIS — Z3480 Encounter for supervision of other normal pregnancy, unspecified trimester: Secondary | ICD-10-CM

## 2022-06-24 DIAGNOSIS — Z8632 Personal history of gestational diabetes: Secondary | ICD-10-CM

## 2022-06-24 DIAGNOSIS — Z2839 Other underimmunization status: Secondary | ICD-10-CM

## 2022-06-24 DIAGNOSIS — Z3A24 24 weeks gestation of pregnancy: Secondary | ICD-10-CM

## 2022-06-24 DIAGNOSIS — O09899 Supervision of other high risk pregnancies, unspecified trimester: Secondary | ICD-10-CM

## 2022-06-24 DIAGNOSIS — O09291 Supervision of pregnancy with other poor reproductive or obstetric history, first trimester: Secondary | ICD-10-CM

## 2022-06-24 MED ORDER — ONDANSETRON 4 MG PO TBDP: 4.0000 mg | ORAL_TABLET | Freq: Three times a day (TID) | ORAL | 1 refills | Status: DC | PRN

## 2022-06-24 NOTE — Progress Notes (Signed)
[id number]  CC: Nausea pt would like Rx for nausea/Zofran .  Pt will send B/P readings in Mychart this afternoon.

## 2022-07-06 ENCOUNTER — Encounter: Payer: Self-pay | Admitting: Obstetrics and Gynecology

## 2022-07-22 ENCOUNTER — Other Ambulatory Visit (HOSPITAL_COMMUNITY)
Admission: RE | Admit: 2022-07-22 | Discharge: 2022-07-22 | Disposition: A | Discharge status: Home / Self Care | Payer: 59 | Source: Ambulatory Visit | Attending: Obstetrics and Gynecology | Admitting: Obstetrics and Gynecology

## 2022-07-22 ENCOUNTER — Ambulatory Visit (INDEPENDENT_AMBULATORY_CARE_PROVIDER_SITE_OTHER): Payer: 59 | Admitting: Obstetrics and Gynecology

## 2022-07-22 ENCOUNTER — Encounter: Payer: 59 | Admitting: Obstetrics & Gynecology

## 2022-07-22 ENCOUNTER — Encounter: Payer: Self-pay | Admitting: Obstetrics and Gynecology

## 2022-07-22 VITALS — BP 134/83 | HR 91 | Wt 210.0 lb

## 2022-07-22 DIAGNOSIS — Z3483 Encounter for supervision of other normal pregnancy, third trimester: Secondary | ICD-10-CM

## 2022-07-22 DIAGNOSIS — Z3480 Encounter for supervision of other normal pregnancy, unspecified trimester: Secondary | ICD-10-CM

## 2022-07-22 DIAGNOSIS — O99013 Anemia complicating pregnancy, third trimester: Secondary | ICD-10-CM

## 2022-07-22 DIAGNOSIS — Z3A28 28 weeks gestation of pregnancy: Secondary | ICD-10-CM

## 2022-07-22 DIAGNOSIS — Z2839 Other underimmunization status: Secondary | ICD-10-CM

## 2022-07-22 DIAGNOSIS — O09893 Supervision of other high risk pregnancies, third trimester: Secondary | ICD-10-CM

## 2022-07-22 DIAGNOSIS — O09899 Supervision of other high risk pregnancies, unspecified trimester: Secondary | ICD-10-CM

## 2022-07-22 MED ORDER — COMFORT FIT MATERNITY SUPP MED MISC: 0 refills | Status: DC

## 2022-07-22 NOTE — Progress Notes (Signed)
   PRENATAL VISIT NOTE  Subjective:  Robin Salas is a 31 y.o. G2P1001 at [redacted]w[redacted]d being seen today for ongoing prenatal care.  She is currently monitored for the following issues for this low-risk pregnancy and has History of gestational diabetes mellitus (GDM) in prior pregnancy, currently pregnant in first trimester; Supervision of other normal pregnancy, antepartum; and Rubella non-immune status, antepartum on their problem list.  Patient reports vaginal irritation and expressed concerns for possible BV infection.  Contractions: Not present. Vag. Bleeding: None.  Movement: Present. Denies leaking of fluid.   The following portions of the patient's history were reviewed and updated as appropriate: allergies, current medications, past family history, past medical history, past social history, past surgical history and problem list.   Objective:   Vitals:   07/22/22 0816  BP: 134/83  Pulse: 91  Weight: 210 lb (95.3 kg)    Fetal Status: Fetal Heart Rate (bpm): 150 Fundal Height: 29 cm Movement: Present     General:  Alert, oriented and cooperative. Patient is in no acute distress.  Skin: Skin is warm and dry. No rash noted.   Cardiovascular: Normal heart rate noted  Respiratory: Normal respiratory effort, no problems with respiration noted  Abdomen: Soft, gravid, appropriate for gestational age.  Pain/Pressure: Present     Pelvic: Cervical exam deferred        Extremities: Normal range of motion.  Edema: None  Mental Status: Normal mood and affect. Normal behavior. Normal judgment and thought content.   Assessment and Plan:  Pregnancy: G2P1001 at [redacted]w[redacted]d 1. Supervision of other normal pregnancy, antepartum Patient is doing well without complaints Third trimester labs today with glucola Follow up growth [place] 11/8 Vaginal swab collected to rule out BV or yeast Patient is not planning any contraception. Would like a BTL if c-section  2. Rubella non-immune status, antepartum Will  offer pp  Preterm labor symptoms and general obstetric precautions including but not limited to vaginal bleeding, contractions, leaking of fluid and fetal movement were reviewed in detail with the patient. Please refer to After Visit Summary for other counseling recommendations.   Return in about 2 weeks (around 08/05/2022) for ROB, Low risk, virtual or in person (patient choice).  Future Appointments  Date Time Provider [place]  08/05/2022  9:35 AM Anyanwu, Sallyanne Havers, MD [facility]  08/06/2022  9:15 AM [facility] NURSE [facility]  08/06/2022  9:30 AM [facility] US3 [facility]  08/19/2022  9:35 AM Anyanwu, Sallyanne Havers, MD [facility]    Mora Bellman, MD

## 2022-07-22 NOTE — Progress Notes (Signed)
ROB [redacted]w[redacted]d  CC: Pelvic pain and vaginal discharge that comes and goes that will have a pinkish tint none today. Was once bright blood but not anymore.  Pt feels vagina is swollen. Pt wants to be sure she does not have BV.    Tdap : offered. Pt will see how she feels after GTT is done.

## 2022-07-23 LAB — CBC
Hematocrit: 30.1 % — ABNORMAL LOW (ref 34.0–46.6)
Hemoglobin: 9.1 g/dL — ABNORMAL LOW (ref 11.1–15.9)
MCH: 20.8 pg — ABNORMAL LOW (ref 26.6–33.0)
MCHC: 30.2 g/dL — ABNORMAL LOW (ref 31.5–35.7)
MCV: 69 fL — ABNORMAL LOW (ref 79–97)
Platelets: 196 10*3/uL (ref 150–450)
RBC: 4.38 x10E6/uL (ref 3.77–5.28)
RDW: 15.8 % — ABNORMAL HIGH (ref 11.7–15.4)
WBC: 9.2 10*3/uL (ref 3.4–10.8)

## 2022-07-23 LAB — CERVICOVAGINAL ANCILLARY ONLY
Bacterial Vaginitis (gardnerella): NEGATIVE
Candida Glabrata: NEGATIVE
Candida Vaginitis: NEGATIVE
Comment: NEGATIVE
Comment: NEGATIVE
Comment: NEGATIVE

## 2022-07-23 LAB — GLUCOSE TOLERANCE, 2 HOURS W/ 1HR
Glucose, 1 hour: 110 mg/dL (ref 70–179)
Glucose, 2 hour: 104 mg/dL (ref 70–152)
Glucose, Fasting: 80 mg/dL (ref 70–91)

## 2022-07-23 LAB — RPR: RPR Ser Ql: NONREACTIVE

## 2022-07-23 LAB — HIV ANTIBODY (ROUTINE TESTING W REFLEX): HIV Screen 4th Generation wRfx: NONREACTIVE

## 2022-07-24 DIAGNOSIS — O99019 Anemia complicating pregnancy, unspecified trimester: Secondary | ICD-10-CM | POA: Insufficient documentation

## 2022-07-24 MED ORDER — FERROUS SULFATE 325 (65 FE) MG PO TABS: 325.0000 mg | ORAL_TABLET | ORAL | 1 refills | Status: DC

## 2022-07-29 ENCOUNTER — Other Ambulatory Visit: Payer: Self-pay | Admitting: Obstetrics & Gynecology

## 2022-07-29 DIAGNOSIS — O99013 Anemia complicating pregnancy, third trimester: Secondary | ICD-10-CM

## 2022-08-04 ENCOUNTER — Encounter (HOSPITAL_COMMUNITY)
Admission: RE | Admit: 2022-08-04 | Discharge: 2022-08-04 | Disposition: A | Discharge status: Home / Self Care | Payer: 59 | Source: Ambulatory Visit | Attending: Obstetrics & Gynecology | Admitting: Obstetrics & Gynecology

## 2022-08-04 DIAGNOSIS — O99013 Anemia complicating pregnancy, third trimester: Secondary | ICD-10-CM | POA: Diagnosis present

## 2022-08-04 MED ORDER — SODIUM CHLORIDE 0.9 % IV SOLN
300.0000 mg | INTRAVENOUS | Status: DC
  Administered 2022-08-04: 300 mg via INTRAVENOUS
  Filled 2022-08-04: qty 300

## 2022-08-05 ENCOUNTER — Telehealth (INDEPENDENT_AMBULATORY_CARE_PROVIDER_SITE_OTHER): Payer: 59 | Admitting: Obstetrics & Gynecology

## 2022-08-05 ENCOUNTER — Encounter: Payer: Self-pay | Admitting: Obstetrics & Gynecology

## 2022-08-05 DIAGNOSIS — D649 Anemia, unspecified: Secondary | ICD-10-CM

## 2022-08-05 DIAGNOSIS — Z3A30 30 weeks gestation of pregnancy: Secondary | ICD-10-CM

## 2022-08-05 DIAGNOSIS — B338 Other specified viral diseases: Secondary | ICD-10-CM | POA: Insufficient documentation

## 2022-08-05 DIAGNOSIS — O99013 Anemia complicating pregnancy, third trimester: Secondary | ICD-10-CM

## 2022-08-05 DIAGNOSIS — O09293 Supervision of pregnancy with other poor reproductive or obstetric history, third trimester: Secondary | ICD-10-CM

## 2022-08-05 DIAGNOSIS — Z3480 Encounter for supervision of other normal pregnancy, unspecified trimester: Secondary | ICD-10-CM

## 2022-08-05 IMAGING — US US OB LIMITED
1 series · 7 of 7 positions shown · non-contrast
Comparison: none

[Series 1: us ob limited · 7 acquisitions, 7 frames shown]
[im 1/7]
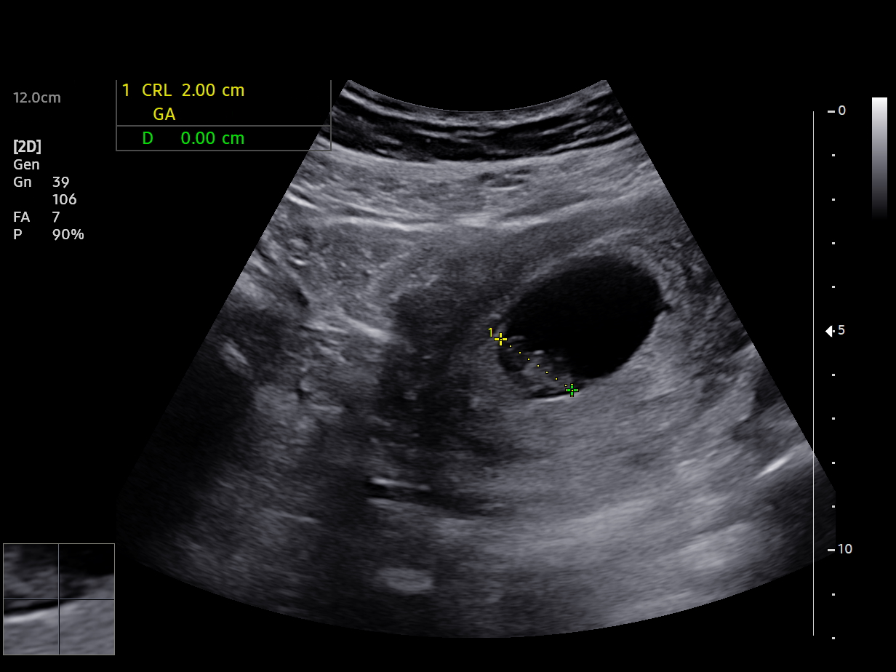
[im 2/7]
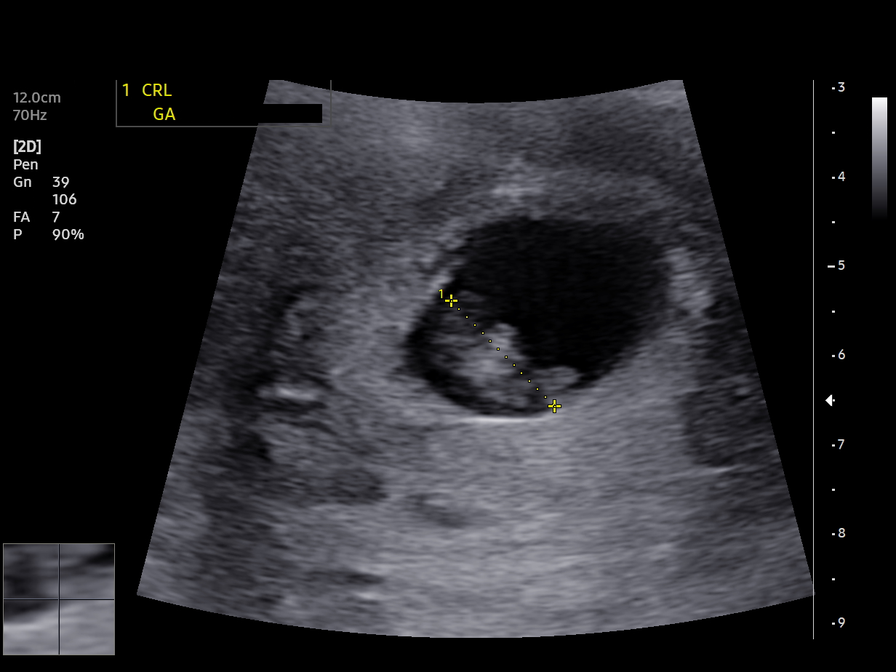
[im 3/7]
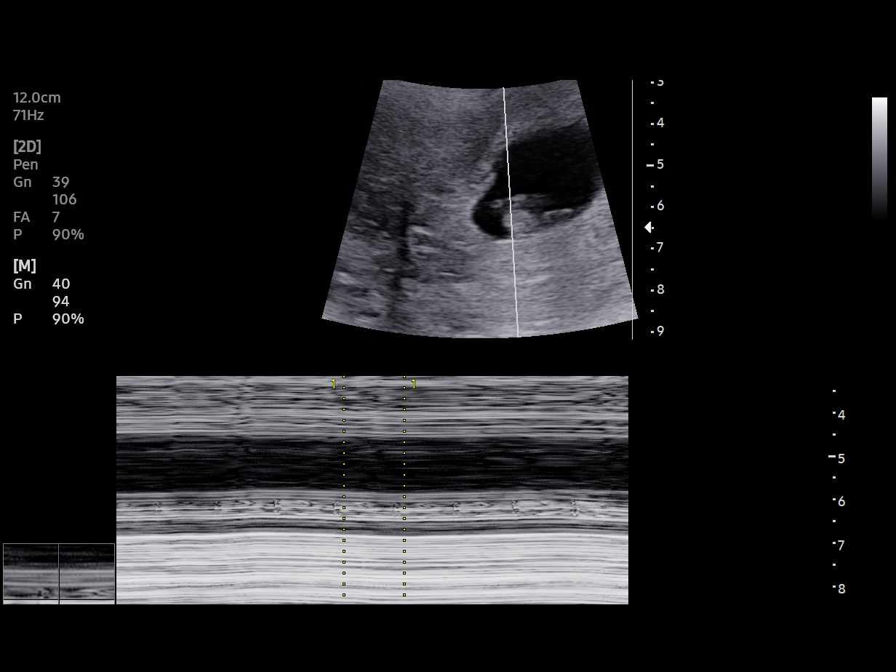
[im 4/7]
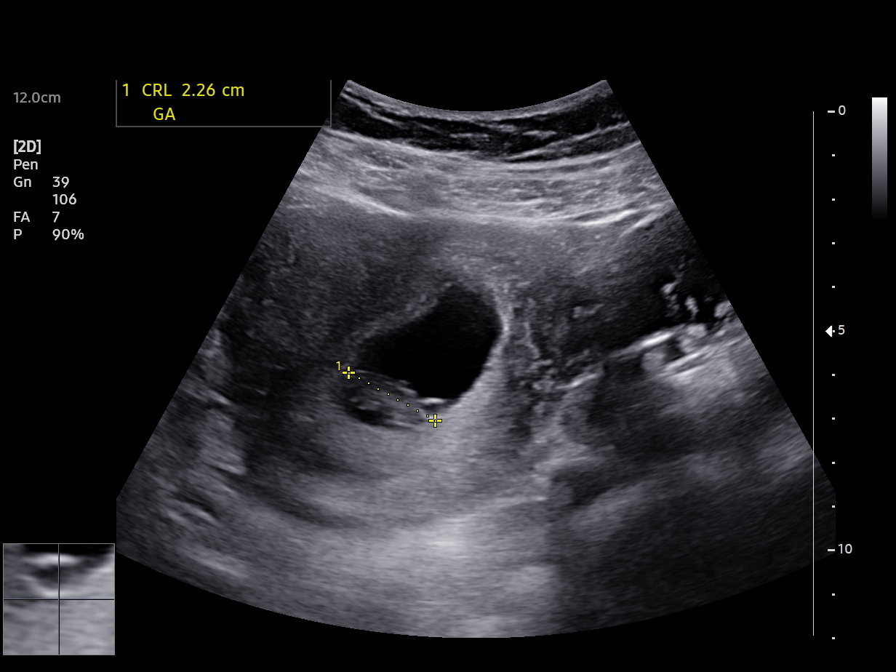
[im 5/7]
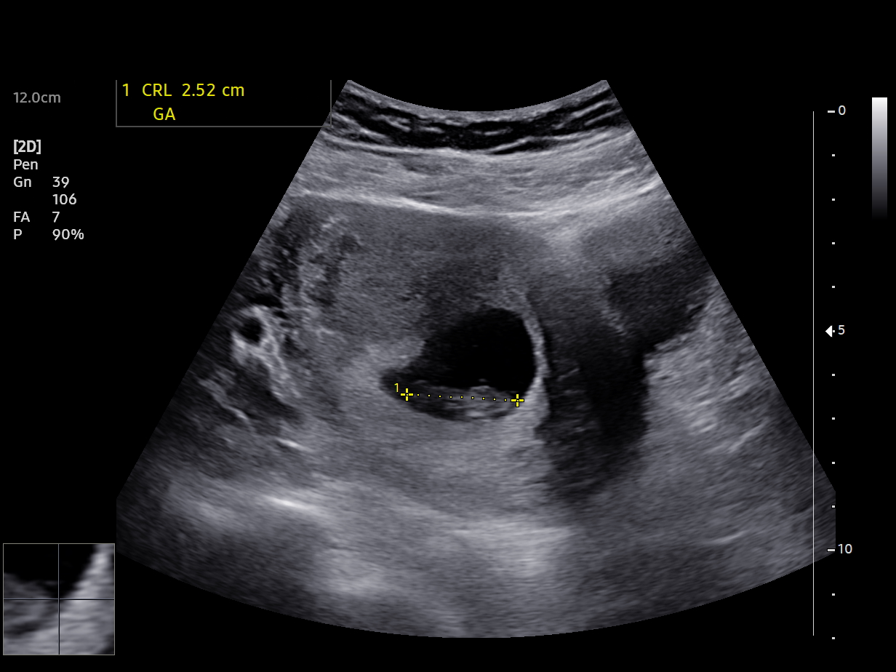
[im 6/7]
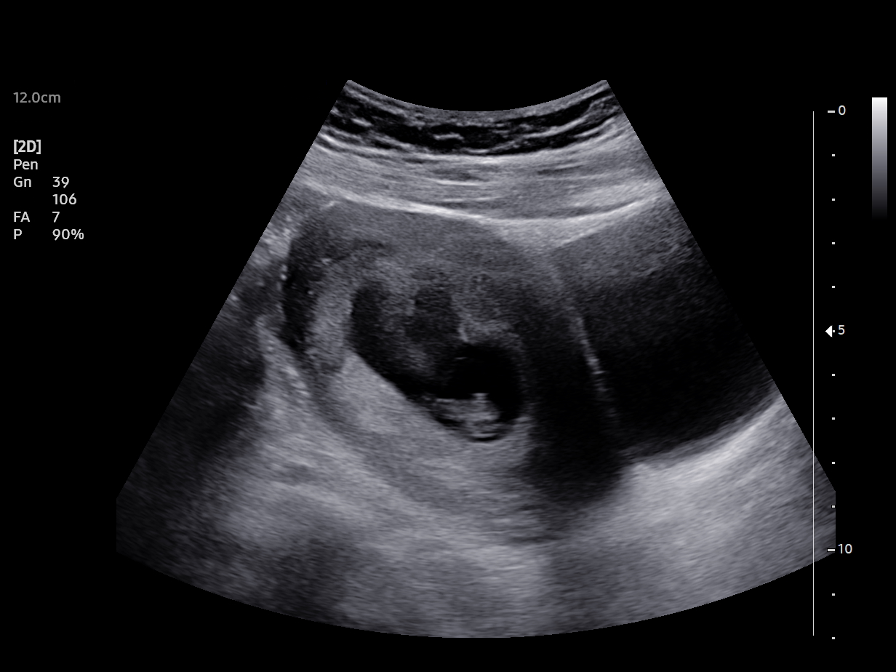
[im 7/7]
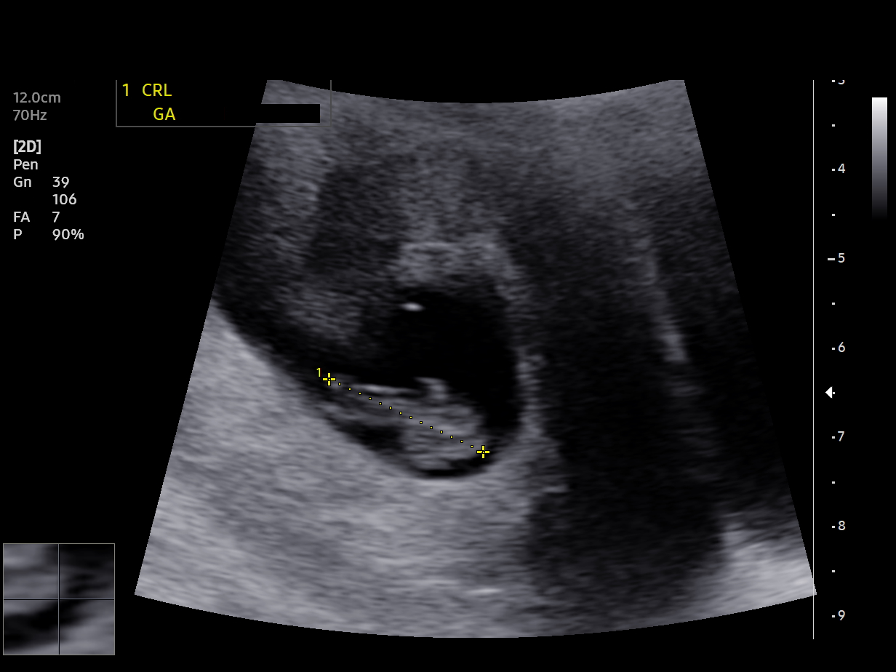

[7 of 7 positions shown; findings below may reference images not displayed]

Attending:        Akeelesh Dajee        Ref. Address:     [REDACTED]
                                                            [facility]
                                                            [place]

 [address]  [HOSPITAL]                         [address]     [place]

Indications

 8 weeks gestation of pregnancy
Fetal Evaluation

 Num Of Fetuses:         1
 Fetal Heart Rate(bpm):  170
 Cardiac Activity:       Observed
Biometry

 CRL:      19.1  mm     G. Age:  8w 2d                   EDD:   10/11/22
OB History

 Gravidity:    2         Term:   1        Prem:   0        SAB:   0
 TOP:          0       Ectopic:  0        Living: 1
Gestational Age

 LMP:           8w 5d         Date:  01/01/22                   EDD:   10/08/22
 Best:          8w 5d      Det. By:  LMP  (01/01/22)          EDD:   10/08/22
Comments

 LMP provided consistent with CRL
Impression

 Viable pregnancy.
Recommendations
 Intiate prenatal care
             YULIANNA WADA

## 2022-08-05 NOTE — Patient Instructions (Signed)
Return to office for any scheduled appointments. Call the office or go to the [facility] [place] if: You begin to have strong, frequent contractions Your water breaks.  Sometimes it is a big gush of fluid, sometimes it is just a trickle that keeps getting your underwear wet or running down your legs You have vaginal bleeding.  It is normal to have a small amount of spotting if your cervix was checked.  You do not feel your baby moving like normal.  If you do not, get something to eat and drink and lay down and focus on feeling your baby move.   If your baby is still not moving like normal, you should call the office or go to MAU. Any other obstetric concerns.  

## 2022-08-05 NOTE — Progress Notes (Signed)
   OBSTETRICS PRENATAL VIRTUAL VISIT ENCOUNTER NOTE  Provider location: [facility] [place] [facility]   Patient location: Home  I connected with Robin Salas on 08/05/22 at  9:35 AM EST by MyChart Video Encounter and verified that I am speaking with the correct person using two identifiers. I discussed the limitations, risks, security and privacy concerns of performing an evaluation and management service virtually and the availability of in person appointments. I also discussed with the patient that there may be a patient responsible charge related to this service. The patient expressed understanding and agreed to proceed. Subjective:  Robin Salas is a 31 y.o. G2P1001 at [redacted]w[redacted]d being seen today for ongoing prenatal care.  She is currently monitored for the following issues for this low-risk pregnancy and has History of gestational diabetes mellitus (GDM) in prior pregnancy, currently pregnant in first trimester; Supervision of other normal pregnancy, antepartum; Rubella non-immune status, antepartum; Anemia affecting pregnancy; and Respiratory syncytial virus (RSV) infection on their problem list.  Patient reports  having RSV infection, contracted from her son. Doing well, no respiratory problems, no fevers .  Contractions: Not present. Vag. Bleeding: None.  Movement: Present. Denies any leaking of fluid.   The following portions of the patient's history were reviewed and updated as appropriate: allergies, current medications, past family history, past medical history, past social history, past surgical history and problem list.   Objective:  There were no vitals filed for this visit.  Fetal Status:     Movement: Present     General:  Alert, oriented and cooperative. Patient is in no acute distress.  Respiratory: Normal respiratory effort, no problems with respiration noted  Mental Status: Normal mood and affect. Normal behavior. Normal judgment and thought content.   Rest of physical exam deferred due to type of encounter  Imaging: No results found.  Assessment and Plan:  Pregnancy: G2P1001 at [redacted]w[redacted]d 1. Respiratory syncytial virus (RSV) infection She is feeling better now, advised to come in for worsening symptoms, respiratory distress.  2. Anemia affecting pregnancy in third trimester Currently receiving Venofer as ordered.  3. [redacted] weeks gestation of pregnancy 4. Supervision of other normal pregnancy, antepartum Normal 2 hr GTT and other labs discussed with patient. Preterm labor symptoms and general obstetric precautions including but not limited to vaginal bleeding, contractions, leaking of fluid and fetal movement were reviewed in detail with the patient. I discussed the assessment and treatment plan with the patient. The patient was provided an opportunity to ask questions and all were answered. The patient agreed with the plan and demonstrated an understanding of the instructions. The patient was advised to call back or seek an in-person office evaluation/go to MAU at [facility] for any urgent or concerning symptoms. Please refer to After Visit Summary for other counseling recommendations.   I provided 15 minutes of face-to-face time during this encounter.  Return in about 2 weeks (around 08/19/2022) for OFFICE OB VISIT (MD only), TDap and Flu vaccine.  Future Appointments  Date Time Provider [place]  08/07/2022  3:30 PM [facility] NURSE [facility]  08/07/2022  3:45 PM [facility] US7 [facility]  08/11/2022  9:00 AM [facility]10 [facility] None  08/19/2022  9:35 AM Chancy Smigiel, Sallyanne Havers, MD [facility]    Verita Schneiders, MD [facility] [place] [place]

## 2022-08-05 NOTE — Progress Notes (Signed)
This encounter was created in error - please disregard.  This encounter was created in error - please disregard.

## 2022-08-06 ENCOUNTER — Ambulatory Visit: Payer: 59

## 2022-08-07 ENCOUNTER — Ambulatory Visit: Payer: 59 | Attending: Obstetrics and Gynecology | Admitting: *Deleted

## 2022-08-07 ENCOUNTER — Ambulatory Visit (HOSPITAL_BASED_OUTPATIENT_CLINIC_OR_DEPARTMENT_OTHER): Payer: 59

## 2022-08-07 VITALS — BP 136/73 | HR 102

## 2022-08-07 DIAGNOSIS — E669 Obesity, unspecified: Secondary | ICD-10-CM

## 2022-08-07 DIAGNOSIS — O99013 Anemia complicating pregnancy, third trimester: Secondary | ICD-10-CM

## 2022-08-07 DIAGNOSIS — Z3A31 31 weeks gestation of pregnancy: Secondary | ICD-10-CM | POA: Insufficient documentation

## 2022-08-07 DIAGNOSIS — O09293 Supervision of pregnancy with other poor reproductive or obstetric history, third trimester: Secondary | ICD-10-CM | POA: Diagnosis not present

## 2022-08-07 DIAGNOSIS — O358XX0 Maternal care for other (suspected) fetal abnormality and damage, not applicable or unspecified: Secondary | ICD-10-CM

## 2022-08-07 DIAGNOSIS — O24113 Pre-existing diabetes mellitus, type 2, in pregnancy, third trimester: Secondary | ICD-10-CM | POA: Insufficient documentation

## 2022-08-07 DIAGNOSIS — Z6833 Body mass index (BMI) 33.0-33.9, adult: Secondary | ICD-10-CM

## 2022-08-07 DIAGNOSIS — O99213 Obesity complicating pregnancy, third trimester: Secondary | ICD-10-CM | POA: Insufficient documentation

## 2022-08-07 DIAGNOSIS — Z3480 Encounter for supervision of other normal pregnancy, unspecified trimester: Secondary | ICD-10-CM

## 2022-08-07 DIAGNOSIS — O09299 Supervision of pregnancy with other poor reproductive or obstetric history, unspecified trimester: Secondary | ICD-10-CM

## 2022-08-11 ENCOUNTER — Encounter (HOSPITAL_COMMUNITY)
Admission: RE | Admit: 2022-08-11 | Discharge: 2022-08-11 | Disposition: A | Discharge status: Home / Self Care | Payer: 59 | Source: Ambulatory Visit | Attending: Obstetrics & Gynecology | Admitting: Obstetrics & Gynecology

## 2022-08-11 DIAGNOSIS — O99013 Anemia complicating pregnancy, third trimester: Secondary | ICD-10-CM

## 2022-08-11 MED ORDER — METHYLPREDNISOLONE SODIUM SUCC 125 MG IJ SOLR: 125.0000 mg | Freq: Once | INTRAMUSCULAR | Status: DC | PRN

## 2022-08-11 MED ORDER — SODIUM CHLORIDE 0.9 % IV SOLN
300.0000 mg | INTRAVENOUS | Status: DC
  Administered 2022-08-11: 300 mg via INTRAVENOUS
  Filled 2022-08-11: qty 300

## 2022-08-11 MED ORDER — DIPHENHYDRAMINE HCL 50 MG/ML IJ SOLN: 25.0000 mg | Freq: Once | INTRAMUSCULAR | Status: DC | PRN

## 2022-08-11 MED ORDER — SODIUM CHLORIDE 0.9 % IV BOLUS: 500.0000 mL | Freq: Once | INTRAVENOUS | Status: DC | PRN

## 2022-08-11 MED ORDER — EPINEPHRINE PF 1 MG/ML IJ SOLN: 0.3000 mg | Freq: Once | INTRAMUSCULAR | Status: DC | PRN

## 2022-08-11 MED ORDER — ALBUTEROL SULFATE (2.5 MG/3ML) 0.083% IN NEBU: 2.5000 mg | INHALATION_SOLUTION | Freq: Once | RESPIRATORY_TRACT | Status: DC | PRN

## 2022-08-11 MED ORDER — SODIUM CHLORIDE 0.9 % IV SOLN: INTRAVENOUS | Status: DC | PRN

## 2022-08-19 ENCOUNTER — Encounter: Payer: Self-pay | Admitting: Obstetrics & Gynecology

## 2022-08-19 ENCOUNTER — Ambulatory Visit (INDEPENDENT_AMBULATORY_CARE_PROVIDER_SITE_OTHER): Payer: 59 | Admitting: Obstetrics & Gynecology

## 2022-08-19 VITALS — BP 136/81 | HR 103 | Wt 214.0 lb

## 2022-08-19 DIAGNOSIS — Z3480 Encounter for supervision of other normal pregnancy, unspecified trimester: Secondary | ICD-10-CM

## 2022-08-19 DIAGNOSIS — Z3A32 32 weeks gestation of pregnancy: Secondary | ICD-10-CM | POA: Diagnosis not present

## 2022-08-19 DIAGNOSIS — O99013 Anemia complicating pregnancy, third trimester: Secondary | ICD-10-CM

## 2022-08-19 DIAGNOSIS — Z3483 Encounter for supervision of other normal pregnancy, third trimester: Secondary | ICD-10-CM

## 2022-08-19 DIAGNOSIS — Z23 Encounter for immunization: Secondary | ICD-10-CM

## 2022-08-19 NOTE — Patient Instructions (Signed)
Return to office for any scheduled appointments. Call the office or go to the [facility]  if: You begin to have strong, frequent contractions Your water breaks.  Sometimes it is a big gush of fluid, sometimes it is just a trickle that keeps getting your underwear wet or running down your legs You have vaginal bleeding.  It is normal to have a small amount of spotting if your cervix was checked.  You do not feel your baby moving like normal.  If you do not, get something to eat and drink and lay down and focus on feeling your baby move.   If your baby is still not moving like normal, you should call the office or go to MAU. Any other obstetric concerns.  

## 2022-08-19 NOTE — Progress Notes (Signed)
PRENATAL VISIT NOTE  Subjective:  Robin Salas is a 31 y.o. G2P1001 at [redacted]w[redacted]d being seen today for ongoing prenatal care.  She is currently monitored for the following issues for this low-risk pregnancy and has History of gestational diabetes mellitus (GDM) in prior pregnancy, currently pregnant in first trimester; Supervision of other normal pregnancy, antepartum; Rubella non-immune status, antepartum; Anemia affecting pregnancy; and Respiratory syncytial virus (RSV) infection on their problem list.  Patient reports  feeling much better since iron infusions for anemia .  Contractions: Irritability. Vag. Bleeding: None.  Movement: Present. Denies leaking of fluid.   The following portions of the patient's history were reviewed and updated as appropriate: allergies, current medications, past family history, past medical history, past social history, past surgical history and problem list.   Objective:   Vitals:   08/19/22 0940  BP: 136/81  Pulse: (!) 103  Weight: 214 lb (97.1 kg)    Fetal Status: Fetal Heart Rate (bpm): 150   Movement: Present     General:  Alert, oriented and cooperative. Patient is in no acute distress.  Skin: Skin is warm and dry. No rash noted.   Cardiovascular: Normal heart rate noted  Respiratory: Normal respiratory effort, no problems with respiration noted  Abdomen: Soft, gravid, appropriate for gestational age.  Pain/Pressure: Present     Pelvic: Cervical exam deferred        Extremities: Normal range of motion.  Edema: Trace  Mental Status: Normal mood and affect. Normal behavior. Normal judgment and thought content.   [place] MFM OB FOLLOW UP  Result Date: 08/07/2022 ----------------------------------------------------------------------  OBSTETRICS REPORT                       (Signed Final 08/07/2022 04:43 pm) ---------------------------------------------------------------------- Patient Info  ID #:       [id number]                          D.O.B.:   12-25-1990 (31 yrs)  Name:       Robin Salas               Visit Date: 08/07/2022 04:29 pm ---------------------------------------------------------------------- Performed By  Attending:        Noralee Space MD        Ref. Address:     [address]                                                             [address]  Performed By:     Cyndie Mull          [facility]  Referred By:      [facility] Mila Merry ---------------------------------------------------------------------- Orders  #  Description                           Code        Ordered By  1  [place] MFM OB FOLLOW UP                   21308.65    Noralee Space ----------------------------------------------------------------------  #  Order #                     Accession #                Episode #  1  [id number]                   [id number]                 [id number] ---------------------------------------------------------------------- Indications  Obesity complicating pregnancy, third          O99.213  trimester (BMI 32.55)  Echogenic intracardiac focus of the heart      O35.8XX0  (EIF)  Poor obstetric history: Previous gestational   O09.299  diabetes  [redacted] weeks gestation of pregnancy                Z3A.31  LR NIPS ---------------------------------------------------------------------- Vital Signs  BP:          136/73 ---------------------------------------------------------------------- Fetal Evaluation  Num Of Fetuses:         1  Fetal Heart Rate(bpm):  147  Cardiac Activity:       Observed  Presentation:           Cephalic  Placenta:               Posterior  P. Cord Insertion:      Previously Visualized  Amniotic Fluid  AFI FV:      Within normal limits  AFI Sum(cm)     %Tile       Largest Pocket(cm)  14.8            52           4.7  RUQ(cm)       RLQ(cm)       LUQ(cm)        LLQ(cm)  3.7           4.7           3.3            3.1 ---------------------------------------------------------------------- Biometry  BPD:      80.4  mm     G. Age:  32w 2d         74  %    CI:        75.55   %    70 - 86  FL/HC:      19.0   %    19.3 - 21.3  HC:      293.3  mm     G. Age:  32w 3d         47  %    HC/AC:      1.02        0.96 - 1.17  AC:      288.1  mm     G. Age:  32w 6d         89  %    FL/BPD:     69.4   %    71 - 87  FL:       55.8  mm     G. Age:  29w 3d        4.4  %    FL/AC:      19.4   %    20 - 24  Est. FW:    1822  gm           4 lb     57  % ---------------------------------------------------------------------- OB History  Blood Type:   A+  Gravidity:    2         Term:   1        Prem:   0        SAB:   0  TOP:          0       Ectopic:  0        Living: 1 ---------------------------------------------------------------------- Gestational Age  LMP:           31w 1d        Date:  01/01/22                   EDD:   10/08/22  U/S Today:     31w 5d                                        EDD:   10/04/22  Best:          31w 1d     Det. By:  LMP  (01/01/22)          EDD:   10/08/22 ---------------------------------------------------------------------- Anatomy  Cranium:               Appears normal         Aortic Arch:            Previously seen  Cavum:                 Appears normal         Ductal Arch:            Previously seen  Ventricles:            Previously seen        Diaphragm:              Appears normal  Choroid Plexus:        Previously seen        Stomach:                Appears normal, left  sided  Cerebellum:            Previously seen        Abdomen:                Appears normal  Posterior Fossa:       Previously seen        Abdominal Wall:         Previously seen  Nuchal Fold:           Previously seen         Cord Vessels:           Previously seen  Face:                  Orbits and profile     Kidneys:                Appear normal                         previously seen  Lips:                  Previously seen        Bladder:                Appears normal  Thoracic:              Appears normal         Spine:                  Previously seen  Heart:                 Previously seen  EIF   Upper Extremities:      Previously seen  RVOT:                  Previously seen        Lower Extremities:      Previously seen  LVOT:                  Previously seen  Other:  Fetus appears to be female. VC, 3VV and 3VTV, Heels/feet and open          hands/5th digits, Nasal bone, lenses, maxilla, mandible and falx          previously visualized ---------------------------------------------------------------------- Cervix Uterus Adnexa  Adnexa  No abnormality visualized. ---------------------------------------------------------------------- Impression  History of GDM.  Fetal growth is appropriate for gestational age .Amniotic fluid  is normal and good fetal activity is seen .  She does not have gestational diabetes . ---------------------------------------------------------------------- Recommendations  -No follow-up appointments were made . ----------------------------------------------------------------------                 Noralee Space, MD Electronically Signed Final Report   08/07/2022 04:43 pm ----------------------------------------------------------------------   Assessment and Plan:  Pregnancy: G2P1001 at [redacted]w[redacted]d 1. Anemia affecting pregnancy in third trimester S/P Venofer x 2.  Will check CBC and ferritin around 36 weeks.  2. Flu vaccine need - Flu Vaccine QUAD 36+ mos IM (Fluarix, Quad PF)   3. Need for Tdap vaccination - Tdap vaccine greater than or equal to 7yo IM  4. [redacted] weeks gestation of pregnancy 5. Supervision of other normal pregnancy, antepartum Preterm labor symptoms and general obstetric precautions  including but not limited to vaginal bleeding, contractions, leaking of fluid and fetal movement were reviewed in detail with the patient. Please refer to After Visit Summary for other  counseling recommendations.   Return in about 2 weeks (around 09/02/2022) for OFFICE OB VISIT (MD or APP).  No future appointments.  Jaynie CollinsUgonna Aara Jacquot, MD

## 2022-08-25 NOTE — Addendum Note (Signed)
Encounter addended by: Claudean Kinds, RN on: 08/25/2022 12:47 PM  Actions taken: Charge Capture section accepted

## 2022-08-28 ENCOUNTER — Inpatient Hospital Stay (HOSPITAL_COMMUNITY)
Admission: AD | Admit: 2022-08-28 | Discharge: 2022-08-28 | Disposition: A | Discharge status: Home / Self Care | Payer: 59 | Attending: Obstetrics and Gynecology | Admitting: Obstetrics and Gynecology

## 2022-08-28 ENCOUNTER — Encounter (HOSPITAL_COMMUNITY): Payer: Self-pay | Admitting: Obstetrics and Gynecology

## 2022-08-28 DIAGNOSIS — Z3A34 34 weeks gestation of pregnancy: Secondary | ICD-10-CM | POA: Diagnosis not present

## 2022-08-28 DIAGNOSIS — O99013 Anemia complicating pregnancy, third trimester: Secondary | ICD-10-CM

## 2022-08-28 DIAGNOSIS — Z8249 Family history of ischemic heart disease and other diseases of the circulatory system: Secondary | ICD-10-CM | POA: Diagnosis not present

## 2022-08-28 DIAGNOSIS — R197 Diarrhea, unspecified: Secondary | ICD-10-CM

## 2022-08-28 DIAGNOSIS — O99891 Other specified diseases and conditions complicating pregnancy: Secondary | ICD-10-CM | POA: Diagnosis not present

## 2022-08-28 DIAGNOSIS — O133 Gestational [pregnancy-induced] hypertension without significant proteinuria, third trimester: Secondary | ICD-10-CM | POA: Diagnosis not present

## 2022-08-28 DIAGNOSIS — Z3480 Encounter for supervision of other normal pregnancy, unspecified trimester: Secondary | ICD-10-CM

## 2022-08-28 DIAGNOSIS — R102 Pelvic and perineal pain: Secondary | ICD-10-CM | POA: Insufficient documentation

## 2022-08-28 DIAGNOSIS — Z8759 Personal history of other complications of pregnancy, childbirth and the puerperium: Secondary | ICD-10-CM | POA: Insufficient documentation

## 2022-08-28 DIAGNOSIS — Z3689 Encounter for other specified antenatal screening: Secondary | ICD-10-CM

## 2022-08-28 DIAGNOSIS — O26893 Other specified pregnancy related conditions, third trimester: Secondary | ICD-10-CM | POA: Diagnosis not present

## 2022-08-28 DIAGNOSIS — R110 Nausea: Secondary | ICD-10-CM | POA: Insufficient documentation

## 2022-08-28 LAB — WET PREP, GENITAL
Clue Cells Wet Prep HPF POC: NONE SEEN
Sperm: NONE SEEN
Trich, Wet Prep: NONE SEEN
WBC, Wet Prep HPF POC: <10 (ref <10)
Yeast Wet Prep HPF POC: NONE SEEN

## 2022-08-28 LAB — URINALYSIS, ROUTINE W REFLEX MICROSCOPIC
Bilirubin Urine: NEGATIVE
Glucose, UA: NEGATIVE mg/dL
Hgb urine dipstick: NEGATIVE
Ketones, ur: 20 mg/dL — AB
Nitrite: NEGATIVE
Protein, ur: 100 mg/dL — AB
Specific Gravity, Urine: 1.029 (ref 1.005–1.030)
pH: 5 (ref 5.0–8.0)

## 2022-08-28 LAB — COMPREHENSIVE METABOLIC PANEL
ALT: 17 U/L (ref 0–44)
AST: 22 U/L (ref 15–41)
Albumin: 2.9 g/dL — ABNORMAL LOW (ref 3.5–5.0)
Alkaline Phosphatase: 119 U/L (ref 38–126)
Anion gap: 12 (ref 5–15)
BUN: 8 mg/dL (ref 6–20)
CO2: 18 mmol/L — ABNORMAL LOW (ref 22–32)
Calcium: 9.3 mg/dL (ref 8.9–10.3)
Chloride: 106 mmol/L (ref 98–111)
Creatinine, Ser: 0.67 mg/dL (ref 0.44–1.00)
GFR, Estimated: >60 mL/min (ref >60)
Glucose, Bld: 103 mg/dL — ABNORMAL HIGH (ref 70–99)
Potassium: 3.5 mmol/L (ref 3.5–5.1)
Sodium: 136 mmol/L (ref 135–145)
Total Bilirubin: 0.9 mg/dL (ref 0.3–1.2)
Total Protein: 6.6 g/dL (ref 6.5–8.1)

## 2022-08-28 LAB — CBC
HCT: 36.4 % (ref 36.0–46.0)
Hemoglobin: 11.3 g/dL — ABNORMAL LOW (ref 12.0–15.0)
MCH: 23 pg — ABNORMAL LOW (ref 26.0–34.0)
MCHC: 31 g/dL (ref 30.0–36.0)
MCV: 74.1 fL — ABNORMAL LOW (ref 80.0–100.0)
Platelets: 178 10*3/uL (ref 150–400)
RBC: 4.91 MIL/uL (ref 3.87–5.11)
RDW: 23.3 % — ABNORMAL HIGH (ref 11.5–15.5)
WBC: 8.4 10*3/uL (ref 4.0–10.5)
nRBC: 0 % (ref 0.0–0.2)

## 2022-08-28 LAB — PROTEIN / CREATININE RATIO, URINE
Creatinine, Urine: 401 mg/dL
Protein Creatinine Ratio: 0.24 mg/mg{Cre} — ABNORMAL HIGH (ref 0.00–0.15)
Total Protein, Urine: 96 mg/dL

## 2022-08-28 MED ORDER — LACTATED RINGERS IV BOLUS
1000.0000 mL | Freq: Once | INTRAVENOUS | Status: AC
  Administered 2022-08-28: 1000 mL via INTRAVENOUS

## 2022-08-28 MED ORDER — LOPERAMIDE HCL 2 MG PO CAPS
4.0000 mg | ORAL_CAPSULE | Freq: Once | ORAL | Status: AC
  Administered 2022-08-28: 4 mg via ORAL
  Filled 2022-08-28: qty 2

## 2022-08-28 NOTE — MAU Provider Note (Signed)
History     CSN: [id number]  Arrival date and time: 08/28/22 1028   Event Date/Time   First Provider Initiated Contact with Patient 08/28/22 1111      Chief Complaint  Patient presents with   Hypertension   Nausea   Diarrhea   Pelvic Pain   Robin Salas is a 31 y.o. G2P1001 at [redacted]w[redacted]d who receives care at [facility].  She presents today for Hypertension, Nausea, Diarrhea, and Pelvic Pain. Patient reports that she has been experiencing increased BH since yesterday morning.   She reports last night, around 2230, she started to experience diarrhea and was waking every 30 minutes to use the bathroom.  She reports watery stools and thinks she has had ~28 episodes since onset.  She also reports pelvic pressure and pain that is improved with rest, however, this has been present for the past month.  She states she was tested for yeast and BV around that time and it was negative.  She also reports some "mucous discharge" x one week that is odorless with milky color.  She endorses HA, but states it is mild.  She has not had any food today.  She reports history of elevated bp prior to pregnancy, but contributes this to "extreme white coat syndrome" as she states her bp improved when retaken later in the appt.  Review of previous pregnancy records shows elevated bp of 140s/80s at 21 and 35 weeks.  One elevation during this pregnancy at 12 weeks.     OB History     Gravida  2   Para  1   Term  1   Preterm      AB      Living  1      SAB      IAB      Ectopic      Multiple  0   Live Births  1           Past Medical History:  Diagnosis Date   Abnormal glucose tolerance test (GTT) during pregnancy, antepartum 12/24/2019   Acid reflux 11/27/2020   Acute blood loss anemia 04/07/2020   Allergic rhinitis 03/01/2015   Anemia    Fracture, pathologic, vertebra    last 4 vertabra   Hyperhidrosis, focal, primary 01/22/2011   Hyperhydrosis disorder    PCOS (polycystic ovarian  syndrome), likely 11/22/2012   Postpartum anxiety 04/08/2020   Status post laparoscopic cholecystectomy 01/01/2021   Robotic assisted, on December 12, 2020   Wedge deformity on x-ray of spine    last 4 vertebra of spine    Past Surgical History:  Procedure Laterality Date   CHOLECYSTECTOMY     WISDOM TOOTH EXTRACTION      Family History  Problem Relation Age of Onset   Hypertension Mother    Hypertension Father    Breast cancer Maternal Aunt    Hypertension Maternal Grandmother    Heart disease Maternal Grandmother    Diabetes Maternal Grandmother    Cancer Maternal Grandmother    Asthma Maternal Grandmother    Hypertension Maternal Grandfather    Heart disease Maternal Grandfather    Prostate cancer Maternal Grandfather    Bone cancer Maternal Grandfather    Hypertension Paternal Grandmother    Heart disease Paternal Grandmother    Hypertension Paternal Grandfather    Heart disease Paternal Grandfather    Birth defects Neg Hx    Stroke Neg Hx     Social History   Tobacco Use  Smoking status: Never   Smokeless tobacco: Never  Vaping Use   Vaping Use: Never used  Substance Use Topics   Alcohol use: No   Drug use: No    Allergies:  Allergies  Allergen Reactions   Amoxicillin Hives   Penicillins Hives    Medications Prior to Admission  Medication Sig Dispense Refill Last Dose   ondansetron (ZOFRAN-ODT) 4 MG disintegrating tablet Take 1 tablet (4 mg total) by mouth every 8 (eight) hours as needed for nausea or vomiting. 20 tablet 1 08/28/2022 at 0700   Elastic Bandages & Supports (COMFORT FIT MATERNITY SUPP MED) MISC Wear daily when ambulating (Patient not taking: Reported on 08/07/2022) 1 each 0    promethazine (PHENERGAN) 25 MG tablet Take 1 tablet (25 mg total) by mouth every 6 (six) hours as needed for nausea or vomiting. 30 tablet 2     Review of Systems  Constitutional:  Negative for chills and fever.  Respiratory:  Negative for cough and shortness of  breath.   Gastrointestinal:  Positive for diarrhea.   Physical Exam   Blood pressure (!) 129/91, pulse (!) 122, temperature 98.6 F (37 C), temperature source Oral, resp. rate 14, height 5\' 2"  (1.575 m), weight 95.9 kg, last menstrual period 01/01/2022, SpO2 99 %. Vitals:   08/28/22 1050 08/28/22 1100 08/28/22 1115 08/28/22 1131  BP: (!) 140/90 (!) 129/91 (!) 135/93 (!) 141/92   08/28/22 1146 08/28/22 1201 08/28/22 1216 08/28/22 1231  BP: 135/83 136/82 (!) 121/105 130/80   08/28/22 1246 08/28/22 1331  BP: 129/84 129/74    Physical Exam Vitals reviewed.  Constitutional:      Appearance: Normal appearance.  HENT:     Head: Normocephalic and atraumatic.  Eyes:     Conjunctiva/sclera: Conjunctivae normal.  Cardiovascular:     Rate and Rhythm: Normal rate and regular rhythm.     Heart sounds: Normal heart sounds.  Pulmonary:     Effort: Pulmonary effort is normal. No respiratory distress.     Breath sounds: Normal breath sounds.  Abdominal:     General: Bowel sounds are normal.     Palpations: Abdomen is soft.     Tenderness: There is no abdominal tenderness.  Musculoskeletal:        General: Normal range of motion.     Cervical back: Normal range of motion.  Skin:    General: Skin is warm and dry.  Neurological:     Mental Status: She is alert and oriented to person, place, and time.  Psychiatric:        Mood and Affect: Mood normal.        Behavior: Behavior normal.     Fetal Assessment 155 bpm, Mod Var, -Decels, +Accels Toco: Q3-22min  MAU Course   Results for orders placed or performed during the hospital encounter of 08/28/22 (from the past 24 hour(s))  Urinalysis, Routine w reflex microscopic Urine, Clean Catch     Status: Abnormal   Collection Time: 08/28/22 10:32 AM  Result Value Ref Range   Color, Urine AMBER (A) YELLOW   APPearance CLOUDY (A) CLEAR   Specific Gravity, Urine 1.029 1.005 - 1.030   pH 5.0 5.0 - 8.0   Glucose, UA NEGATIVE NEGATIVE mg/dL    Hgb urine dipstick NEGATIVE NEGATIVE   Bilirubin Urine NEGATIVE NEGATIVE   Ketones, ur 20 (A) NEGATIVE mg/dL   Protein, ur 08/30/22 (A) NEGATIVE mg/dL   Nitrite NEGATIVE NEGATIVE   Leukocytes,Ua TRACE (A) NEGATIVE   RBC /  HPF 0-5 0 - 5 RBC/hpf   WBC, UA 11-20 0 - 5 WBC/hpf   Bacteria, UA MANY (A) NONE SEEN   Squamous Epithelial / LPF 6-10 0 - 5   Mucus PRESENT    Non Squamous Epithelial 0-5 (A) NONE SEEN  Protein / creatinine ratio, urine     Status: Abnormal   Collection Time: 08/28/22 10:56 AM  Result Value Ref Range   Creatinine, Urine 401 mg/dL   Total Protein, Urine 96 mg/dL   Protein Creatinine Ratio 0.24 (H) 0.00 - 0.15 mg/mg[Cre]  CBC     Status: Abnormal   Collection Time: 08/28/22 11:13 AM  Result Value Ref Range   WBC 8.4 4.0 - 10.5 K/uL   RBC 4.91 3.87 - 5.11 MIL/uL   Hemoglobin 11.3 (L) 12.0 - 15.0 g/dL   HCT 29.536.4 62.136.0 - 30.846.0 %   MCV 74.1 (L) 80.0 - 100.0 fL   MCH 23.0 (L) 26.0 - 34.0 pg   MCHC 31.0 30.0 - 36.0 g/dL   RDW 65.723.3 (H) 84.611.5 - 96.215.5 %   Platelets 178 150 - 400 K/uL   nRBC 0.0 0.0 - 0.2 %  Comprehensive metabolic panel     Status: Abnormal   Collection Time: 08/28/22 11:13 AM  Result Value Ref Range   Sodium 136 135 - 145 mmol/L   Potassium 3.5 3.5 - 5.1 mmol/L   Chloride 106 98 - 111 mmol/L   CO2 18 (L) 22 - 32 mmol/L   Glucose, Bld 103 (H) 70 - 99 mg/dL   BUN 8 6 - 20 mg/dL   Creatinine, Ser 9.520.67 0.44 - 1.00 mg/dL   Calcium 9.3 8.9 - 84.110.3 mg/dL   Total Protein 6.6 6.5 - 8.1 g/dL   Albumin 2.9 (L) 3.5 - 5.0 g/dL   AST 22 15 - 41 U/L   ALT 17 0 - 44 U/L   Alkaline Phosphatase 119 38 - 126 U/L   Total Bilirubin 0.9 0.3 - 1.2 mg/dL   GFR, Estimated >32>60 >44>60 mL/min   Anion gap 12 5 - 15  Wet prep, genital     Status: None   Collection Time: 08/28/22 11:31 AM  Result Value Ref Range   Yeast Wet Prep HPF POC NONE SEEN NONE SEEN   Trich, Wet Prep NONE SEEN NONE SEEN   Clue Cells Wet Prep HPF POC NONE SEEN NONE SEEN   WBC, Wet Prep HPF POC <10 <10    Sperm NONE SEEN    No results found.  MDM Physical Exam with C Labs: CBC, CMP, PC Ratio Measure BPQ15 min Anti-Diarrhea  Start IV  LR Bolus x 2 Consult Assessment and Plan  31 year old G2P1001  SIUP at 34.1 weeks Cat I FT Diarrhea Elevated BP  -Exam performed and findings discussed. -Culture collected.  -Discussed giving imodium for diarrhea. -Labs for PreE evaluation. -Start IV and give fluids. -NST Reactive. -Monitor and reassess.    Cherre RobinsJessica L Adriell Polansky MSN, CNM 08/28/2022, 11:12 AM   Reassessment (1:14 PM) Gestational Hypertension  -Dr. Marquis LunchU. Anyanwu consulted and informed of patient status, evaluation, interventions, and results. Advised: *Proceed with diagnosis of GHTN. *Patient to follow up as scheduled. -Provider to bedside to discuss findings and follow up. -Patient reports she feels "like a new woman!" -Instructed to proceed with bland, clear diet and advance as tolerated. -Instructed to follow up as scheduled.  -Encouraged to call primary office or return to MAU if symptoms worsen or with the onset of new symptoms. -  Discharged to home in improved condition.  Cherre Robins MSN, CNM Advanced Practice Provider, [facility] [employer]

## 2022-08-28 NOTE — MAU Note (Addendum)
.  Robin Salas is a 31 y.o. at [redacted]w[redacted]d here in MAU reporting: started having braxton hicks over the last few days. Reports that yesterday they got more intense and then around 2200 she began having watery diarrhea and nausea. Has been taking ODT zofran for the nausea which has helped, but continues to have diarrhea (around 28 episodes since 2200). Denies VB or LOF. +FM. Also endorsing pelvic pain and feels like her pelvis is "ripping apart." Since she was feeling unwell this morning, she checked her BP and noted that it was 140s/80s&90s. Pain score: 7 Lab orders placed from triage:  UA

## 2022-08-29 LAB — CULTURE, OB URINE: Culture: NO GROWTH

## 2022-08-29 NOTE — Progress Notes (Unsigned)
   PRENATAL VISIT NOTE  Subjective:  Robin Salas is a 31 y.o. G2P1001 at 28w6dbeing seen today for ongoing prenatal care.  She is currently monitored for the following issues for this high-risk pregnancy and has History of gestational diabetes mellitus (GDM) in prior pregnancy, currently pregnant in first trimester; Supervision of other normal pregnancy, antepartum; Rubella non-immune status, antepartum; Anemia affecting pregnancy; Respiratory syncytial virus (RSV) infection; and Gestational hypertension, third trimester on their problem list.  Patient reports no complaints.  Contractions: Not present. Vag. Bleeding: Other.  Movement: Present. Denies leaking of fluid.   The following portions of the patient's history were reviewed and updated as appropriate: allergies, current medications, past family history, past medical history, past social history, past surgical history and problem list.   Objective:   Vitals:   09/02/22 1001  BP: 126/82  Pulse: 84  Weight: 212 lb (96.2 kg)    Fetal Status: Fetal Heart Rate (bpm): 140   Movement: Present     General:  Alert, oriented and cooperative. Patient is in no acute distress.  Skin: Skin is warm and dry. No rash noted.   Cardiovascular: Normal heart rate noted  Respiratory: Normal respiratory effort, no problems with respiration noted  Abdomen: Soft, gravid, appropriate for gestational age.  Pain/Pressure: Absent     Pelvic: Cervical exam performed in the presence of a chaperone        Extremities: Normal range of motion.     Mental Status: Normal mood and affect. Normal behavior. Normal judgment and thought content.   Assessment and Plan:  Pregnancy: G2P1001 at 397w5d. Gestational hypertension, third trimester Met criteria in MAU on 11/30. Labs wnl. Discussed plan for delivery at 37w. Reviewed S/Sx of preeclampsia.  Scheduled for IOL at [place] 12/20  2. Supervision of other normal pregnancy, antepartum Cultures done today.  Declines CE. Had one in MAU and was closed.  Discussed MOC: Condoms  3. Rubella non-immune status, antepartum MMR after delivery.   4. Anemia affecting pregnancy in third trimester - s/p IV iron x 2. HgB in MAU was 11.3 much improved from 9.1.   5. History of gestational diabetes mellitus (GDM) in prior pregnancy, currently pregnant in first trimester 2 hr normal. Growth on 11/9 was wnl at 57%ile.   Preterm labor symptoms and general obstetric precautions including but not limited to vaginal bleeding, contractions, leaking of fluid and fetal movement were reviewed in detail with the patient. Please refer to After Visit Summary for other counseling recommendations.   Return in about 1 week (around 09/09/2022) for OB VISIT, MD or APP.  Future Appointments  Date Time Provider [place]12/19/2023 10:55 AM DuRadene GunningMD [facility]  09/17/2022 12:00 AM [facility] None  09/26/2022 10:55 AM Anyanwu, UgSallyanne HaversMD [facility]  10/01/2022 11:15 AM PrDonnamae JudeMD [facility]  10/08/2022  8:55 AM PrDonnamae JudeMD [facility]    PaRadene GunningMD

## 2022-09-02 ENCOUNTER — Ambulatory Visit (INDEPENDENT_AMBULATORY_CARE_PROVIDER_SITE_OTHER): Payer: 59 | Admitting: Obstetrics and Gynecology

## 2022-09-02 ENCOUNTER — Other Ambulatory Visit (HOSPITAL_COMMUNITY)
Admission: RE | Admit: 2022-09-02 | Discharge: 2022-09-02 | Disposition: A | Discharge status: Home / Self Care | Payer: 59 | Source: Ambulatory Visit | Attending: Obstetrics and Gynecology | Admitting: Obstetrics and Gynecology

## 2022-09-02 ENCOUNTER — Other Ambulatory Visit: Payer: Self-pay | Admitting: Obstetrics and Gynecology

## 2022-09-02 ENCOUNTER — Encounter: Payer: Self-pay | Admitting: Obstetrics and Gynecology

## 2022-09-02 VITALS — BP 126/82 | HR 84 | Wt 212.0 lb

## 2022-09-02 DIAGNOSIS — O09291 Supervision of pregnancy with other poor reproductive or obstetric history, first trimester: Secondary | ICD-10-CM

## 2022-09-02 DIAGNOSIS — Z8632 Personal history of gestational diabetes: Secondary | ICD-10-CM

## 2022-09-02 DIAGNOSIS — O09899 Supervision of other high risk pregnancies, unspecified trimester: Secondary | ICD-10-CM

## 2022-09-02 DIAGNOSIS — Z3480 Encounter for supervision of other normal pregnancy, unspecified trimester: Secondary | ICD-10-CM | POA: Diagnosis present

## 2022-09-02 DIAGNOSIS — O09293 Supervision of pregnancy with other poor reproductive or obstetric history, third trimester: Secondary | ICD-10-CM

## 2022-09-02 DIAGNOSIS — Z3483 Encounter for supervision of other normal pregnancy, third trimester: Secondary | ICD-10-CM

## 2022-09-02 DIAGNOSIS — O133 Gestational [pregnancy-induced] hypertension without significant proteinuria, third trimester: Secondary | ICD-10-CM

## 2022-09-02 DIAGNOSIS — O09893 Supervision of other high risk pregnancies, third trimester: Secondary | ICD-10-CM

## 2022-09-02 DIAGNOSIS — O99013 Anemia complicating pregnancy, third trimester: Secondary | ICD-10-CM

## 2022-09-02 DIAGNOSIS — Z2839 Other underimmunization status: Secondary | ICD-10-CM

## 2022-09-03 LAB — CERVICOVAGINAL ANCILLARY ONLY
Chlamydia: NEGATIVE
Comment: NEGATIVE
Comment: NORMAL
Neisseria Gonorrhea: NEGATIVE

## 2022-09-06 LAB — STREP GP B CULTURE+RFLX: Strep Gp B Culture+Rflx: NEGATIVE

## 2022-09-09 ENCOUNTER — Ambulatory Visit (INDEPENDENT_AMBULATORY_CARE_PROVIDER_SITE_OTHER): Payer: 59 | Admitting: Obstetrics & Gynecology

## 2022-09-09 ENCOUNTER — Telehealth (HOSPITAL_COMMUNITY): Payer: Self-pay | Admitting: *Deleted

## 2022-09-09 ENCOUNTER — Encounter: Payer: Self-pay | Admitting: Obstetrics & Gynecology

## 2022-09-09 ENCOUNTER — Encounter (HOSPITAL_COMMUNITY): Payer: Self-pay

## 2022-09-09 ENCOUNTER — Ambulatory Visit (INDEPENDENT_AMBULATORY_CARE_PROVIDER_SITE_OTHER): Payer: 59

## 2022-09-09 VITALS — BP 133/87 | HR 93 | Wt 216.0 lb

## 2022-09-09 DIAGNOSIS — O99713 Diseases of the skin and subcutaneous tissue complicating pregnancy, third trimester: Secondary | ICD-10-CM | POA: Diagnosis not present

## 2022-09-09 DIAGNOSIS — O99013 Anemia complicating pregnancy, third trimester: Secondary | ICD-10-CM

## 2022-09-09 DIAGNOSIS — O0993 Supervision of high risk pregnancy, unspecified, third trimester: Secondary | ICD-10-CM

## 2022-09-09 DIAGNOSIS — L299 Pruritus, unspecified: Secondary | ICD-10-CM

## 2022-09-09 DIAGNOSIS — Z3A35 35 weeks gestation of pregnancy: Secondary | ICD-10-CM

## 2022-09-09 DIAGNOSIS — O133 Gestational [pregnancy-induced] hypertension without significant proteinuria, third trimester: Secondary | ICD-10-CM | POA: Diagnosis not present

## 2022-09-09 DIAGNOSIS — O219 Vomiting of pregnancy, unspecified: Secondary | ICD-10-CM

## 2022-09-09 MED ORDER — ONDANSETRON 4 MG PO TBDP: 4.0000 mg | ORAL_TABLET | Freq: Four times a day (QID) | ORAL | 0 refills | Status: DC | PRN

## 2022-09-09 MED ORDER — HYDROXYZINE HCL 25 MG PO TABS: 25.0000 mg | ORAL_TABLET | Freq: Four times a day (QID) | ORAL | 2 refills | Status: DC | PRN

## 2022-09-09 MED ORDER — PANTOPRAZOLE SODIUM 40 MG PO TBEC: 40.0000 mg | DELAYED_RELEASE_TABLET | Freq: Every day | ORAL | 2 refills | Status: DC

## 2022-09-09 NOTE — Addendum Note (Signed)
Addended by: Jaynie Collins A on: 09/09/2022 03:10 PM   Modules accepted: Orders

## 2022-09-09 NOTE — Telephone Encounter (Signed)
Preadmission screen  

## 2022-09-09 NOTE — Progress Notes (Addendum)
   PRENATAL VISIT NOTE  Subjective:  Robin Salas is a 31 y.o. G2P1001 at [redacted]w[redacted]d being seen today for ongoing prenatal care.  She is currently monitored for the following issues for this high-risk pregnancy and has History of gestational diabetes mellitus (GDM) in prior pregnancy, currently pregnant in first trimester; Supervision of high-risk pregnancy; Rubella non-immune status, antepartum; Anemia affecting pregnancy; Respiratory syncytial virus (RSV) infection; and Gestational hypertension, third trimester on their problem list.  Patient reports  severe diffuse itching, especially on palms . Recently diagnosed with GHTN, she denies any headaches, visual symptoms, RUQ/epigastric pain or other concerning symptoms.  Also complained about nausea, wants refill of Zofran.  Contractions: Not present. Vag. Bleeding: None.  Movement: Present. Denies leaking of fluid.   The following portions of the patient's history were reviewed and updated as appropriate: allergies, current medications, past family history, past medical history, past social history, past surgical history and problem list.   Objective:   Vitals:   09/09/22 1431  BP: 133/87  Pulse: 93  Weight: 216 lb (98 kg)    Fetal Status: Fetal Heart Rate (bpm): 152   Movement: Present     General:  Alert, oriented and cooperative. Patient is in no acute distress.  Skin: Skin is warm and dry. No rash noted.   Cardiovascular: Normal heart rate noted  Respiratory: Normal respiratory effort, no problems with respiration noted  Abdomen: Soft, gravid, appropriate for gestational age.  Pain/Pressure: Absent     Pelvic: Cervical exam deferred        Extremities: Normal range of motion.  Edema: Trace  Mental Status: Normal mood and affect. Normal behavior. Normal judgment and thought content.   Assessment and Plan:  Pregnancy: G2P1001 at [redacted]w[redacted]d 1. Gestational hypertension, third trimester BP normal today, already scheduled for IOL on  09/17/22.  Surveillance labs done today. Will also get BPP in office today and next week prior to IOL.  PEC precautions reviewed. - Comprehensive metabolic panel - CBC - Protein / creatinine ratio, urine - US FETAL BPP W/NONSTRESS; Future  2. Pruritus of pregnancy in third trimester Concerned about possible cholestasis, this was discussed with patient. Will check labs, surveillance already being done for [facility] and already scheduled for IOL at 37 weeks.  - Bile acids, total - Comprehensive metabolic panel - hydrOXYzine (ATARAX) 25 MG tablet; Take 1 tablet (25 mg total) by mouth every 6 (six) hours as needed for itching.  Dispense: 30 tablet; Refill: 2 - US FETAL BPP W/NONSTRESS; Future  3. Nausea and vomiting during pregnancy Zofran refilled. Already taking Nexium. - ondansetron (ZOFRAN-ODT) 4 MG disintegrating tablet; Take 1 tablet (4 mg total) by mouth every 6 (six) hours as needed for nausea.  Dispense: 20 tablet; Refill: 0  4. Anemia affecting pregnancy in third trimester She is s/p IV iron x 2. HgB in 08/28/22 was 11.3 much improved from 9.1.    5. [redacted] weeks gestation of pregnancy 6. Supervision of high risk pregnancy in third trimester Preterm labor symptoms and general obstetric precautions including but not limited to vaginal bleeding, contractions, leaking of fluid and fetal movement were reviewed in detail with the patient. Please refer to After Visit Summary for other counseling recommendations.   Return in about 6 days (around 09/15/2022) for NST, BPP, OFFICE OB VISIT (MD only).  Future Appointments  Date Time Provider Department Center  09/17/2022 12:00 AM [facility] SCHED ROOM [facility] None    Jaynie Collins, MD

## 2022-09-10 ENCOUNTER — Telehealth (HOSPITAL_COMMUNITY): Payer: Self-pay | Admitting: *Deleted

## 2022-09-10 ENCOUNTER — Encounter (HOSPITAL_COMMUNITY): Payer: Self-pay | Admitting: *Deleted

## 2022-09-10 LAB — CBC
Hematocrit: 34.3 % (ref 34.0–46.6)
Hemoglobin: 11.3 g/dL (ref 11.1–15.9)
MCH: 23.9 pg — ABNORMAL LOW (ref 26.6–33.0)
MCHC: 32.9 g/dL (ref 31.5–35.7)
MCV: 73 fL — ABNORMAL LOW (ref 79–97)
Platelets: 196 10*3/uL (ref 150–450)
RBC: 4.73 x10E6/uL (ref 3.77–5.28)
RDW: 22.2 % — ABNORMAL HIGH (ref 11.7–15.4)
WBC: 9.3 10*3/uL (ref 3.4–10.8)

## 2022-09-10 LAB — COMPREHENSIVE METABOLIC PANEL
ALT: 84 IU/L — ABNORMAL HIGH (ref 0–32)
AST: 41 IU/L — ABNORMAL HIGH (ref 0–40)
Albumin/Globulin Ratio: 1.4 (ref 1.2–2.2)
Albumin: 3.6 g/dL — ABNORMAL LOW (ref 4.0–5.0)
Alkaline Phosphatase: 195 IU/L — ABNORMAL HIGH (ref 44–121)
BUN/Creatinine Ratio: 11 (ref 9–23)
BUN: 7 mg/dL (ref 6–20)
Bilirubin Total: 0.7 mg/dL (ref 0.0–1.2)
CO2: 17 mmol/L — ABNORMAL LOW (ref 20–29)
Calcium: 8.9 mg/dL (ref 8.7–10.2)
Chloride: 105 mmol/L (ref 96–106)
Creatinine, Ser: 0.66 mg/dL (ref 0.57–1.00)
Globulin, Total: 2.6 g/dL (ref 1.5–4.5)
Glucose: 130 mg/dL — ABNORMAL HIGH (ref 70–99)
Potassium: 4 mmol/L (ref 3.5–5.2)
Sodium: 137 mmol/L (ref 134–144)
Total Protein: 6.2 g/dL (ref 6.0–8.5)
eGFR: 122 mL/min/{1.73_m2} (ref >59)

## 2022-09-10 LAB — PROTEIN / CREATININE RATIO, URINE
Creatinine, Urine: 210.6 mg/dL
Protein, Ur: 53 mg/dL
Protein/Creat Ratio: 252 mg/g creat — ABNORMAL HIGH (ref 0–200)

## 2022-09-10 LAB — BILE ACIDS, TOTAL: Bile Acids Total: 19 umol/L (ref 0.0–10.0)

## 2022-09-10 NOTE — Telephone Encounter (Signed)
Preadmission screen  

## 2022-09-11 ENCOUNTER — Encounter: Payer: Self-pay | Admitting: *Deleted

## 2022-09-11 ENCOUNTER — Other Ambulatory Visit: Payer: Self-pay | Admitting: *Deleted

## 2022-09-11 ENCOUNTER — Encounter: Payer: Self-pay | Admitting: Obstetrics & Gynecology

## 2022-09-11 DIAGNOSIS — K831 Obstruction of bile duct: Secondary | ICD-10-CM | POA: Insufficient documentation

## 2022-09-11 DIAGNOSIS — O26613 Liver and biliary tract disorders in pregnancy, third trimester: Secondary | ICD-10-CM | POA: Insufficient documentation

## 2022-09-11 MED ORDER — URSODIOL 500 MG PO TABS: 500.0000 mg | ORAL_TABLET | Freq: Two times a day (BID) | ORAL | 0 refills | Status: DC

## 2022-09-11 NOTE — Progress Notes (Signed)
Patient has cholestasis. Please prescribe Ursodiol for her too, either 300 mg tid or 500 mg bid. She is already scheduled for antenatal testing then delivery at 37 weeks. Please call and inform patient of results and call L&D to add this indication for reason for induction. Also please add to problem list.

## 2022-09-15 ENCOUNTER — Other Ambulatory Visit: Payer: 59

## 2022-09-16 ENCOUNTER — Ambulatory Visit (INDEPENDENT_AMBULATORY_CARE_PROVIDER_SITE_OTHER): Payer: 59 | Admitting: *Deleted

## 2022-09-16 ENCOUNTER — Ambulatory Visit (INDEPENDENT_AMBULATORY_CARE_PROVIDER_SITE_OTHER): Payer: 59

## 2022-09-16 ENCOUNTER — Encounter: Payer: 59 | Admitting: Obstetrics and Gynecology

## 2022-09-16 VITALS — BP 138/85 | HR 88 | Wt 217.0 lb

## 2022-09-16 DIAGNOSIS — O26643 Intrahepatic cholestasis of pregnancy, third trimester: Secondary | ICD-10-CM

## 2022-09-16 DIAGNOSIS — Z3A37 37 weeks gestation of pregnancy: Secondary | ICD-10-CM | POA: Diagnosis not present

## 2022-09-16 DIAGNOSIS — K831 Obstruction of bile duct: Secondary | ICD-10-CM

## 2022-09-16 DIAGNOSIS — O133 Gestational [pregnancy-induced] hypertension without significant proteinuria, third trimester: Secondary | ICD-10-CM

## 2022-09-16 DIAGNOSIS — Z3A36 36 weeks gestation of pregnancy: Secondary | ICD-10-CM

## 2022-09-16 DIAGNOSIS — O26613 Liver and biliary tract disorders in pregnancy, third trimester: Secondary | ICD-10-CM

## 2022-09-16 DIAGNOSIS — O0993 Supervision of high risk pregnancy, unspecified, third trimester: Secondary | ICD-10-CM

## 2022-09-16 NOTE — Progress Notes (Signed)
Patient informed that the ultrasound is considered a limited obstetric ultrasound and is not intended to be a complete ultrasound exam.  Patient also informed that the ultrasound is not being completed with the intent of assessing for fetal or placental anomalies or any pelvic abnormalities. Explained that the purpose of today's ultrasound is to assess for fetal well being.  Patient acknowledges the purpose of the exam and the limitations of the study.      Rosabelle Jupin, RN      

## 2022-09-17 ENCOUNTER — Other Ambulatory Visit: Payer: Self-pay

## 2022-09-17 ENCOUNTER — Inpatient Hospital Stay (HOSPITAL_COMMUNITY): Payer: 59

## 2022-09-17 ENCOUNTER — Encounter (HOSPITAL_COMMUNITY): Payer: Self-pay | Admitting: Family Medicine

## 2022-09-17 ENCOUNTER — Inpatient Hospital Stay (HOSPITAL_COMMUNITY)
Admission: RE | Admit: 2022-09-17 | Discharge: 2022-09-18 | DRG: 807 | Disposition: A | Discharge status: Home / Self Care | Payer: 59 | Attending: Obstetrics and Gynecology | Admitting: Obstetrics and Gynecology

## 2022-09-17 DIAGNOSIS — O0990 Supervision of high risk pregnancy, unspecified, unspecified trimester: Secondary | ICD-10-CM

## 2022-09-17 DIAGNOSIS — Z2839 Other underimmunization status: Secondary | ICD-10-CM

## 2022-09-17 DIAGNOSIS — O09899 Supervision of other high risk pregnancies, unspecified trimester: Secondary | ICD-10-CM

## 2022-09-17 DIAGNOSIS — Z8759 Personal history of other complications of pregnancy, childbirth and the puerperium: Secondary | ICD-10-CM | POA: Diagnosis present

## 2022-09-17 DIAGNOSIS — O9902 Anemia complicating childbirth: Secondary | ICD-10-CM | POA: Diagnosis present

## 2022-09-17 DIAGNOSIS — O134 Gestational [pregnancy-induced] hypertension without significant proteinuria, complicating childbirth: Secondary | ICD-10-CM | POA: Diagnosis present

## 2022-09-17 DIAGNOSIS — Z8632 Personal history of gestational diabetes: Secondary | ICD-10-CM | POA: Diagnosis not present

## 2022-09-17 DIAGNOSIS — Z3A37 37 weeks gestation of pregnancy: Secondary | ICD-10-CM

## 2022-09-17 DIAGNOSIS — O2662 Liver and biliary tract disorders in childbirth: Secondary | ICD-10-CM

## 2022-09-17 DIAGNOSIS — O99019 Anemia complicating pregnancy, unspecified trimester: Secondary | ICD-10-CM | POA: Diagnosis present

## 2022-09-17 DIAGNOSIS — O26643 Intrahepatic cholestasis of pregnancy, third trimester: Secondary | ICD-10-CM | POA: Diagnosis present

## 2022-09-17 DIAGNOSIS — K831 Obstruction of bile duct: Secondary | ICD-10-CM | POA: Diagnosis present

## 2022-09-17 DIAGNOSIS — O133 Gestational [pregnancy-induced] hypertension without significant proteinuria, third trimester: Secondary | ICD-10-CM | POA: Diagnosis present

## 2022-09-17 DIAGNOSIS — Z23 Encounter for immunization: Secondary | ICD-10-CM

## 2022-09-17 LAB — CBC
HCT: 30.5 % — ABNORMAL LOW (ref 36.0–46.0)
HCT: 32.3 % — ABNORMAL LOW (ref 36.0–46.0)
Hemoglobin: 9.7 g/dL — ABNORMAL LOW (ref 12.0–15.0)
Hemoglobin: 10.2 g/dL — ABNORMAL LOW (ref 12.0–15.0)
MCH: 23.4 pg — ABNORMAL LOW (ref 26.0–34.0)
MCH: 23.2 pg — ABNORMAL LOW (ref 26.0–34.0)
MCHC: 31.8 g/dL (ref 30.0–36.0)
MCHC: 31.6 g/dL (ref 30.0–36.0)
MCV: 73.7 fL — ABNORMAL LOW (ref 80.0–100.0)
MCV: 73.6 fL — ABNORMAL LOW (ref 80.0–100.0)
Platelets: 164 10*3/uL (ref 150–400)
Platelets: 176 10*3/uL (ref 150–400)
RBC: 4.14 MIL/uL (ref 3.87–5.11)
RBC: 4.39 MIL/uL (ref 3.87–5.11)
RDW: 21.3 % — ABNORMAL HIGH (ref 11.5–15.5)
RDW: 21.4 % — ABNORMAL HIGH (ref 11.5–15.5)
WBC: 9.9 10*3/uL (ref 4.0–10.5)
WBC: 10 10*3/uL (ref 4.0–10.5)
nRBC: 0 % (ref 0.0–0.2)
nRBC: 0 % (ref 0.0–0.2)

## 2022-09-17 LAB — TYPE AND SCREEN
ABO/RH(D): A POS
Antibody Screen: NEGATIVE

## 2022-09-17 LAB — COMPREHENSIVE METABOLIC PANEL
ALT: 17 U/L (ref 0–44)
AST: 18 U/L (ref 15–41)
Albumin: 2.6 g/dL — ABNORMAL LOW (ref 3.5–5.0)
Alkaline Phosphatase: 160 U/L — ABNORMAL HIGH (ref 38–126)
Anion gap: 8 (ref 5–15)
BUN: <5 mg/dL — ABNORMAL LOW (ref 6–20)
CO2: 20 mmol/L — ABNORMAL LOW (ref 22–32)
Calcium: 9.1 mg/dL (ref 8.9–10.3)
Chloride: 108 mmol/L (ref 98–111)
Creatinine, Ser: 0.68 mg/dL (ref 0.44–1.00)
GFR, Estimated: >60 mL/min (ref >60)
Glucose, Bld: 82 mg/dL (ref 70–99)
Potassium: 4 mmol/L (ref 3.5–5.1)
Sodium: 136 mmol/L (ref 135–145)
Total Bilirubin: 0.7 mg/dL (ref 0.3–1.2)
Total Protein: 6.2 g/dL — ABNORMAL LOW (ref 6.5–8.1)

## 2022-09-17 LAB — RPR: RPR Ser Ql: NONREACTIVE

## 2022-09-17 MED ORDER — DIPHENHYDRAMINE HCL 50 MG/ML IJ SOLN: 12.5000 mg | INTRAMUSCULAR | Status: DC | PRN

## 2022-09-17 MED ORDER — ONDANSETRON HCL 4 MG/2ML IJ SOLN: 4.0000 mg | Freq: Four times a day (QID) | INTRAMUSCULAR | Status: DC | PRN

## 2022-09-17 MED ORDER — LIDOCAINE HCL (PF) 1 % IJ SOLN
INTRAMUSCULAR | Status: AC
  Administered 2022-09-17: 30 mL
  Filled 2022-09-17: qty 30

## 2022-09-17 MED ORDER — FUROSEMIDE 20 MG PO TABS
20.0000 mg | ORAL_TABLET | Freq: Every day | ORAL | Status: DC
  Administered 2022-09-17 – 2022-09-18 (×2): 20 mg via ORAL
  Filled 2022-09-17 (×2): qty 1

## 2022-09-17 MED ORDER — SOD CITRATE-CITRIC ACID 500-334 MG/5ML PO SOLN: 30.0000 mL | ORAL | Status: DC | PRN

## 2022-09-17 MED ORDER — LACTATED RINGERS IV SOLN
500.0000 mL | Freq: Once | INTRAVENOUS | Status: AC
  Administered 2022-09-17: 500 mL via INTRAVENOUS

## 2022-09-17 MED ORDER — OXYTOCIN BOLUS FROM INFUSION
333.0000 mL | Freq: Once | INTRAVENOUS | Status: AC
  Administered 2022-09-17: 333 mL via INTRAVENOUS

## 2022-09-17 MED ORDER — SIMETHICONE 80 MG PO CHEW: 80.0000 mg | CHEWABLE_TABLET | ORAL | Status: DC | PRN

## 2022-09-17 MED ORDER — PRENATAL MULTIVITAMIN CH
1.0000 | ORAL_TABLET | Freq: Every day | ORAL | Status: DC
  Filled 2022-09-17 (×2): qty 1

## 2022-09-17 MED ORDER — EPHEDRINE 5 MG/ML INJ: 10.0000 mg | INTRAVENOUS | Status: DC | PRN

## 2022-09-17 MED ORDER — ACETAMINOPHEN 325 MG PO TABS: 650.0000 mg | ORAL_TABLET | ORAL | Status: DC | PRN

## 2022-09-17 MED ORDER — SODIUM CHLORIDE 0.9 % IV SOLN: INTRAVENOUS | Status: DC | PRN

## 2022-09-17 MED ORDER — BENZOCAINE-MENTHOL 20-0.5 % EX AERO
1.0000 | INHALATION_SPRAY | CUTANEOUS | Status: DC | PRN
  Administered 2022-09-17: 1 via TOPICAL
  Filled 2022-09-17: qty 56

## 2022-09-17 MED ORDER — SODIUM CHLORIDE 0.9% FLUSH: 3.0000 mL | Freq: Two times a day (BID) | INTRAVENOUS | Status: DC

## 2022-09-17 MED ORDER — LACTATED RINGERS IV SOLN: INTRAVENOUS | Status: DC

## 2022-09-17 MED ORDER — DIPHENHYDRAMINE HCL 25 MG PO CAPS: 25.0000 mg | ORAL_CAPSULE | Freq: Four times a day (QID) | ORAL | Status: DC | PRN

## 2022-09-17 MED ORDER — KETOROLAC TROMETHAMINE 30 MG/ML IJ SOLN: 30.0000 mg | Freq: Once | INTRAMUSCULAR | Status: AC

## 2022-09-17 MED ORDER — LIDOCAINE HCL (PF) 1 % IJ SOLN
30.0000 mL | INTRAMUSCULAR | Status: AC | PRN
  Administered 2022-09-17: 30 mL via SUBCUTANEOUS
  Filled 2022-09-17: qty 30

## 2022-09-17 MED ORDER — OXYCODONE-ACETAMINOPHEN 5-325 MG PO TABS: 1.0000 | ORAL_TABLET | ORAL | Status: DC | PRN

## 2022-09-17 MED ORDER — MISOPROSTOL 25 MCG QUARTER TABLET
25.0000 ug | ORAL_TABLET | Freq: Once | ORAL | Status: AC
  Administered 2022-09-17: 25 ug via VAGINAL
  Filled 2022-09-17: qty 1

## 2022-09-17 MED ORDER — FENTANYL-BUPIVACAINE-NACL 0.5-0.125-0.9 MG/250ML-% EP SOLN
12.0000 mL/h | EPIDURAL | Status: DC | PRN
  Filled 2022-09-17: qty 250

## 2022-09-17 MED ORDER — ONDANSETRON HCL 4 MG PO TABS: 4.0000 mg | ORAL_TABLET | ORAL | Status: DC | PRN

## 2022-09-17 MED ORDER — MISOPROSTOL 50MCG HALF TABLET
50.0000 ug | ORAL_TABLET | Freq: Once | ORAL | Status: AC
  Administered 2022-09-17: 50 ug via ORAL
  Filled 2022-09-17: qty 1

## 2022-09-17 MED ORDER — COCONUT OIL OIL: 1.0000 | TOPICAL_OIL | Status: DC | PRN

## 2022-09-17 MED ORDER — LIDOCAINE HCL 1 % IJ SOLN: 30.0000 mL | Freq: Once | INTRAMUSCULAR | Status: DC

## 2022-09-17 MED ORDER — LACTATED RINGERS IV SOLN: 500.0000 mL | INTRAVENOUS | Status: DC | PRN

## 2022-09-17 MED ORDER — ONDANSETRON HCL 4 MG/2ML IJ SOLN: 4.0000 mg | INTRAMUSCULAR | Status: DC | PRN

## 2022-09-17 MED ORDER — OXYTOCIN-SODIUM CHLORIDE 30-0.9 UT/500ML-% IV SOLN
2.5000 [IU]/h | INTRAVENOUS | Status: DC
  Administered 2022-09-17: 2.5 [IU]/h via INTRAVENOUS
  Filled 2022-09-17: qty 500

## 2022-09-17 MED ORDER — ZOLPIDEM TARTRATE 5 MG PO TABS: 5.0000 mg | ORAL_TABLET | Freq: Every evening | ORAL | Status: DC | PRN

## 2022-09-17 MED ORDER — TERBUTALINE SULFATE 1 MG/ML IJ SOLN: 0.2500 mg | Freq: Once | INTRAMUSCULAR | Status: DC | PRN

## 2022-09-17 MED ORDER — LIDOCAINE HCL (PF) 1 % IJ SOLN: 30.0000 mL | INTRAMUSCULAR | Status: DC | PRN

## 2022-09-17 MED ORDER — PHENYLEPHRINE 80 MCG/ML (10ML) SYRINGE FOR IV PUSH (FOR BLOOD PRESSURE SUPPORT): 80.0000 ug | PREFILLED_SYRINGE | INTRAVENOUS | Status: DC | PRN

## 2022-09-17 MED ORDER — FENTANYL CITRATE (PF) 100 MCG/2ML IJ SOLN
50.0000 ug | INTRAMUSCULAR | Status: DC | PRN
  Administered 2022-09-17: 100 ug via INTRAVENOUS
  Filled 2022-09-17: qty 2

## 2022-09-17 MED ORDER — OXYTOCIN-SODIUM CHLORIDE 30-0.9 UT/500ML-% IV SOLN: 1.0000 m[IU]/min | INTRAVENOUS | Status: DC

## 2022-09-17 MED ORDER — OXYCODONE-ACETAMINOPHEN 5-325 MG PO TABS: 2.0000 | ORAL_TABLET | ORAL | Status: DC | PRN

## 2022-09-17 MED ORDER — IBUPROFEN 600 MG PO TABS
600.0000 mg | ORAL_TABLET | Freq: Four times a day (QID) | ORAL | Status: DC
  Filled 2022-09-17: qty 1

## 2022-09-17 MED ORDER — SODIUM CHLORIDE 0.9% FLUSH: 3.0000 mL | INTRAVENOUS | Status: DC | PRN

## 2022-09-17 MED ORDER — WITCH HAZEL-GLYCERIN EX PADS: 1.0000 | MEDICATED_PAD | CUTANEOUS | Status: DC | PRN

## 2022-09-17 MED ORDER — DIBUCAINE (PERIANAL) 1 % EX OINT
1.0000 | TOPICAL_OINTMENT | CUTANEOUS | Status: DC | PRN
  Administered 2022-09-17: 1 via RECTAL
  Filled 2022-09-17: qty 28

## 2022-09-17 MED ORDER — MEASLES, MUMPS & RUBELLA VAC IJ SOLR: 0.5000 mL | Freq: Once | INTRAMUSCULAR | Status: DC

## 2022-09-17 MED ORDER — SENNOSIDES-DOCUSATE SODIUM 8.6-50 MG PO TABS
2.0000 | ORAL_TABLET | ORAL | Status: DC
  Administered 2022-09-17: 2 via ORAL
  Filled 2022-09-17: qty 2

## 2022-09-17 NOTE — Discharge Summary (Signed)
Postpartum Discharge Summary  Patient Name: Robin Salas DOB: Dec 08, 1990 MRN: [id number]  Date of admission: 09/17/2022 Delivery date:09/17/2022  Delivering provider: Stormy Card  Date of discharge: 09/18/2022  Admitting diagnosis: Cholestasis [K83.1] Intrauterine pregnancy: [redacted]w[redacted]d    Secondary diagnosis:  Principal Problem:   Vaginal delivery Active Problems:   Supervision of high-risk pregnancy   Rubella non-immune status, antepartum   Anemia affecting pregnancy   Gestational hypertension, third trimester   Cholestasis during pregnancy in third trimester   Cholestasis  Additional problems: None    Discharge diagnosis: Term Pregnancy Delivered, Gestational Hypertension, and Anemia                                              Post partum procedures: none Augmentation: AROM and Cytotec Complications: None  Hospital course: Induction of Labor With Vaginal Delivery   31y.o. yo G2P2002 at 29w0das admitted to the hospital 09/17/2022 for induction of labor.  Indication for induction: Gestational hypertension and Cholestasis of pregnancy.  Patient had an labor course that was uncomplicated. Membrane Rupture Time/Date: 10:05 AM ,09/17/2022   Delivery Method:Vaginal, Spontaneous  Episiotomy: None  Lacerations:  2nd degree;Periurethral  Details of delivery can be found in separate delivery note.  Patient had a postpartum course complicated by hypertension and was started on nifedipine and lasix.she has a history of postpartum anxiety requiring brief medication treatment in previous pregnancy. Rx for sertraline sent in so she has this on hand if symptoms recur. She will follow up for 1 week check, and desires to inquire about iron infusion post partum at that visit. Patient is discharged home 09/18/22.  Newborn Data: Birth date:09/17/2022  Birth time:11:45 AM  Gender:Female  Living status:Living  Apgars:9 ,9  Weight:3050 g   Magnesium Sulfate received: No BMZ  received: No Rhophylac:N/A MMR:Yes T-DaP:Given prenatally Flu: Given prenatally Transfusion:No  Physical exam  Vitals:   09/17/22 1946 09/17/22 2310 09/18/22 0315 09/18/22 1149  BP: 136/84 133/82 133/76 133/84  Pulse: 100 90 96 89  Resp: _0 Temp: 98.4 F (36.9 C) 98.2 F (36.8 C) 98.2 F (36.8 C)   TempSrc: Oral Oral Oral   SpO2:      Weight:      Height:       General: alert, cooperative, and no distress Lochia: appropriate Uterine Fundus: firm Incision: N/A DVT Evaluation: No evidence of DVT seen on physical exam. Labs: Lab Results  Component Value Date   WBC 9.0 09/18/2022   HGB 9.2 (L) 09/18/2022   HCT 27.5 (L) 09/18/2022   MCV 72.6 (L) 09/18/2022   PLT 160 09/18/2022      Latest Ref Rng & Units 09/17/2022   11:22 AM  CMP  Glucose 70 - 99 mg/dL 82   BUN 6 - 20 mg/dL <5   Creatinine 0.44 - 1.00 mg/dL 0.68   Sodium 135 - 145 mmol/L 136   Potassium 3.5 - 5.1 mmol/L 4.0   Chloride 98 - 111 mmol/L 108   CO2 22 - 32 mmol/L 20   Calcium 8.9 - 10.3 mg/dL 9.1   Total Protein 6.5 - 8.1 g/dL 6.2   Total Bilirubin 0.3 - 1.2 mg/dL 0.7   Alkaline Phos 38 - 126 U/L 160   AST 15 - 41 U/L 18   ALT 0 - 44 U/L 17  Edinburgh Score:    09/17/2022    1:49 PM  Edinburgh Postnatal Depression Scale Screening Tool  I have been able to laugh and see the funny side of things. 0  I have looked forward with enjoyment to things. 0  I have blamed myself unnecessarily when things went wrong. 0  I have been anxious or worried for no good reason. 1  I have felt scared or panicky for no good reason. 1  Things have been getting on top of me. 0  I have been so unhappy that I have had difficulty sleeping. 0  I have felt sad or miserable. 0  I have been so unhappy that I have been crying. 0  The thought of harming myself has occurred to me. 0  Edinburgh Postnatal Depression Scale Total 2     After visit meds:  Allergies as of 09/18/2022       Reactions   Amoxil  [amoxicillin] Hives   Penicillins Hives   Wound Dressing Adhesive Itching, Rash   Unknown adhesive        Medication List     STOP taking these medications    hydrOXYzine 25 MG tablet Commonly known as: ATARAX   NEXIUM 24HR PO   ondansetron 4 MG disintegrating tablet Commonly known as: ZOFRAN-ODT   promethazine 25 MG tablet Commonly known as: PHENERGAN   TUMS PO   ursodiol 500 MG tablet Commonly known as: ACTIGALL       TAKE these medications    furosemide 20 MG tablet Commonly known as: LASIX Take 1 tablet (20 mg total) by mouth daily for 3 days. Start taking on: September 19, 2022   NIFEdipine 30 MG 24 hr tablet Commonly known as: ADALAT CC Take 1 tablet (30 mg total) by mouth daily. Start taking on: September 19, 2022   prenatal multivitamin Tabs tablet Take 1 tablet by mouth daily at 12 noon. Start taking on: September 19, 2022   PREPARATION H EX Apply 1 application  topically 4 (four) times daily as needed (hemorrhoids).   sertraline 25 MG tablet Commonly known as: Zoloft Take 1 tablet (25 mg total) by mouth daily. Start if experiencing anxiety or depression symptoms post partum         Discharge home in stable condition Infant Feeding: Breast Infant Disposition:home with mother Discharge instruction: per After Visit Summary and Postpartum booklet. Activity: Advance as tolerated. Pelvic rest for 6 weeks.  Diet: routine diet Future Appointments: Future Appointments  Date Time Provider [place]  09/24/2022  1:50 PM [facility] NURSE [facility]  10/29/2022 10:55 AM Donnamae Jude, MD [facility]   Follow up Visit: - as above.   Liliane Channel MD MPH OB Fellow, [place] for [place] 09/18/2022

## 2022-09-17 NOTE — Lactation Note (Signed)
This note was copied from a baby's chart. Lactation Consultation Note  Patient Name: Robin Salas WHQPR'[id number] Date: 09/17/2022 Reason for consult: Initial assessment Age:29 hours   LC Note:  Per RN, mother declines lactation services.   Maternal Data    Feeding Mother's Current Feeding Choice: Breast Milk  LATCH Score                    Lactation Tools Discussed/Used    Interventions    Discharge    Consult Status Consult Status: Complete (mother declined follow up)    Shakeisha Horine R Kaiah Hosea 09/17/2022, 1:13 PM

## 2022-09-17 NOTE — H&P (Addendum)
OBSTETRIC ADMISSION HISTORY AND PHYSICAL  Robin Salas is a 31 y.o. female G2P1001 with IUP at [redacted]w[redacted]d by LMP presenting for IOL Cholestasis. She reports +FMs, No LOF, no VB, no blurry vision, headaches or peripheral edema, and RUQ pain.  She plans on breast feeding. She request condoms for birth control. She received her prenatal care at  [facility]    Dating: By LMP --->  Estimated Date of Delivery: 10/08/22  Sono:    @[redacted]w[redacted]d , CWD, normal anatomy, cephalic presentation, [id number], 57% EFW   Prenatal History/Complications:  Patient Active Problem List   Diagnosis Date Noted   Cholestasis 09/17/2022   Cholestasis during pregnancy in third trimester 09/11/2022   Gestational hypertension, third trimester 08/28/2022   Respiratory syncytial virus (RSV) infection 08/05/2022   Anemia affecting pregnancy 07/24/2022   Rubella non-immune status, antepartum 03/28/2022   History of gestational diabetes mellitus (GDM) in prior pregnancy, currently pregnant in first trimester 03/03/2022   Supervision of high-risk pregnancy 03/03/2022    Nursing Staff Provider  Office Location  [place] Dating  LMP c/w 8 weeks scan  Language  english Anatomy US  Normal Anatomy overall with EICF noted.  Flu Vaccine  08/19/22 Genetic/Carrier Screen  NIPS:   Low risk female AFP:   Declined, nml spine on anatomy Horizon: Declined  TDaP Vaccine   08/19/22 Hgb A1C or  GTT Early 5.4 Third trimester   COVID Vaccine    LAB RESULTS   Rhogam  N/A Blood Type A/Positive/-- (06/29 1040)   Baby Feeding Plan Breast Antibody Negative (06/29 1040)  Contraception Condoms Rubella <0.90 (06/29 1040)  Circumcision N/A RPR Non Reactive (10/24 0825)   Pediatrician   HBsAg Negative (06/29 1040)   Support Person FOB HCVAb  Neg  Prenatal Classes  HIV Non Reactive (10/24 0825)     BTL Consent  GBS   (For PCN allergy, check sensitivities)   VBAC Consent  Pap        DME Rx [ ]  BP cuff [ ]  Weight Scale Waterbirth  [ ]  Class [ ]  Consent [ ]  CNM  visit  PHQ9 & GAD7 [  ] new OB [  ] 28 weeks  [  ] 36 weeks Induction  [ ]  Orders Entered [ ] Foley Y/N    Past Medical History: Past Medical History:  Diagnosis Date   Abnormal glucose tolerance test (GTT) during pregnancy, antepartum 12/24/2019   Acid reflux 11/27/2020   Acute blood loss anemia 04/07/2020   Allergic rhinitis 03/01/2015   Anemia    Fracture, pathologic, vertebra    last 4 vertabra   Hyperhidrosis, focal, primary 01/22/2011   Hyperhydrosis disorder    PCOS (polycystic ovarian syndrome), likely 11/22/2012   Postpartum anxiety 04/08/2020   Pregnancy induced hypertension    Status post laparoscopic cholecystectomy 01/01/2021   Robotic assisted, on December 12, 2020   Wedge deformity on x-ray of spine    last 4 vertebra of spine    Past Surgical History: Past Surgical History:  Procedure Laterality Date   CHOLECYSTECTOMY     WISDOM TOOTH EXTRACTION      Obstetrical History: OB History     Gravida  2   Para  1   Term  1   Preterm      AB      Living  1      SAB      IAB      Ectopic      Multiple  0   Live Births  1           Social History Social History   Socioeconomic History   Marital status: Single    Spouse name: Not on file   Number of children: Not on file   Years of education: Not on file   Highest education level: Not on file  Occupational History   Not on file  Tobacco Use   Smoking status: Never   Smokeless tobacco: Never  Vaping Use   Vaping Use: Never used  Substance and Sexual Activity   Alcohol use: No   Drug use: No   Sexual activity: Yes    Birth control/protection: None  Other Topics Concern   Not on file  Social History Narrative   Not on file   Social Determinants of Health   Financial Resource Strain: Not on file  Food Insecurity: No Food Insecurity (09/17/2022)   Hunger Vital Sign    Worried About Running Out of Food in the Last Year: Never true    Ran Out of Food in the Last Year: Never  true  Transportation Needs: No Transportation Needs (09/17/2022)   PRAPARE - Administrator, Civil Service (Medical): No    Lack of Transportation (Non-Medical): No  Physical Activity: Not on file  Stress: Not on file  Social Connections: Not on file    Family History: Family History  Problem Relation Age of Onset   Hypertension Mother    Hypertension Father    Breast cancer Maternal Aunt    Hypertension Maternal Grandmother    Heart disease Maternal Grandmother    Diabetes Maternal Grandmother    Cancer Maternal Grandmother    Asthma Maternal Grandmother    Hypertension Maternal Grandfather    Heart disease Maternal Grandfather    Prostate cancer Maternal Grandfather    Bone cancer Maternal Grandfather    Hypertension Paternal Grandmother    Heart disease Paternal Grandmother    Hypertension Paternal Grandfather    Heart disease Paternal Grandfather    Birth defects Neg Hx    Stroke Neg Hx     Allergies: Allergies  Allergen Reactions   Amoxicillin Hives   Penicillins Hives    Medications Prior to Admission  Medication Sig Dispense Refill Last Dose   hydrOXYzine (ATARAX) 25 MG tablet Take 1 tablet (25 mg total) by mouth every 6 (six) hours as needed for itching. 30 tablet 2    ondansetron (ZOFRAN-ODT) 4 MG disintegrating tablet Take 1 tablet (4 mg total) by mouth every 8 (eight) hours as needed for nausea or vomiting. 20 tablet 1    ondansetron (ZOFRAN-ODT) 4 MG disintegrating tablet Take 1 tablet (4 mg total) by mouth every 6 (six) hours as needed for nausea. 20 tablet 0    promethazine (PHENERGAN) 25 MG tablet Take 1 tablet (25 mg total) by mouth every 6 (six) hours as needed for nausea or vomiting. 30 tablet 2    ursodiol (ACTIGALL) 500 MG tablet Take 1 tablet (500 mg total) by mouth 2 (two) times daily. 60 tablet 0      Review of Systems   All systems reviewed and negative except as stated in HPI  Blood pressure 130/87, pulse 95, temperature 98.6  F (37 C), temperature source Oral, resp. rate 18, height 5\' 2"  (1.575 m), weight 98.1 kg, last menstrual period 01/01/2022, SpO2 100 %. General appearance: alert, cooperative, and appears stated age Lungs: clear to auscultation bilaterally Heart: regular rate and rhythm Abdomen: soft, non-tender; bowel sounds normal  Pelvic: no lesions Extremities: Homans sign is negative, no sign of DVT Presentation: cephalic Fetal monitoringBaseline: 145 bpm, Variability: Good {> 6 bpm), Accelerations: Reactive, and Decelerations: Absent Uterine activityFrequency: Every 4 minutes Dilation: 3 Effacement (%): 50 Station: -3 Exam by:: Dr. Dorena Cookey   Prenatal labs: ABO, Rh: A/Positive/-- (06/29 1040) Antibody: Negative (06/29 1040) Rubella: <0.90 (06/29 1040) RPR: Non Reactive (10/24 0825)  HBsAg: Negative (06/29 1040)  HIV: Non Reactive (10/24 0825)  GBS: Negative/-- (12/05 1039)  1 hr Glucola nml Genetic screening  nml Anatomy US nml  Prenatal Transfer Tool  Maternal Diabetes: No Genetic Screening: Normal Maternal Ultrasounds/Referrals: Normal Fetal Ultrasounds or other Referrals:  None Maternal Substance Abuse:  No Significant Maternal Medications:  None Significant Maternal Lab Results:  Group B Strep negative Number of Prenatal Visits:greater than 3 verified prenatal visits Other Comments:  None  No results found for this or any previous visit (from the past 24 hour(s)).  Patient Active Problem List   Diagnosis Date Noted   Cholestasis 09/17/2022   Cholestasis during pregnancy in third trimester 09/11/2022   Gestational hypertension, third trimester 08/28/2022   Respiratory syncytial virus (RSV) infection 08/05/2022   Anemia affecting pregnancy 07/24/2022   Rubella non-immune status, antepartum 03/28/2022   History of gestational diabetes mellitus (GDM) in prior pregnancy, currently pregnant in first trimester 03/03/2022   Supervision of high-risk pregnancy 03/03/2022     Assessment/Plan:  Robin Salas is a 31 y.o. G2P1001 at [redacted]w[redacted]d here for IOL cholestasis  #Labor: Cytotec 50/25 mcg PO/VAG, ambulate #Pain: IV pain meds, epidural upon request #FWB: CAT 1 #ID:  GBS neg #MOF: Breast #MOC:Condoms #Cholestasis: Bile acids 19 on 12/13. On ursodiol 500 BID.   [place], DO  09/17/2022, 1:25 AM

## 2022-09-17 NOTE — Progress Notes (Addendum)
Labor Progress Note  Robin Salas is a 31 y.o. G2P1001 at [redacted]w[redacted]d presented for IOL Cholestasis  S: Pt feeling her contractions more often  O:  BP 129/80 (BP Location: Left Arm)   Pulse 80   Temp 98.6 F (37 C) (Oral)   Resp 16   Ht 5\' 2"  (1.575 m)   Wt 98.1 kg   LMP 01/01/2022   SpO2 100%   BMI 39.56 kg/m  EFM: 155bpm/Moderate variability/ 15x15 accels/ None decels  CVE: Dilation: 4 Effacement (%): 50 Cervical Position: Middle Station: -3 Presentation: Vertex Exam by:: [email or web address], DO   [name]: 31 y.o. G2P1001 [redacted]w[redacted]d  here for IOL as above  #Labor: Progressing well. Pt prefers to not augment further until next SVE since she is now contracting every 2-3 mins #Pain: Family/Friend support #FWB: CAT 1 #GBS negative  [redacted]w[redacted]d, DO FMOB Fellow, Faculty practice [facility], Center for [facility] 09/17/22  6:09 AM

## 2022-09-17 NOTE — Progress Notes (Signed)
Labor Progress Note RETTA PITCHER is a 31 y.o. G2P1001 at [redacted]w[redacted]d presented for IOL for cholestasis S: doing well. Bouncing on the ball. Feels contractions, but not too painful  O:  BP (!) 142/90 (BP Location: Left Arm)   Pulse 84   Temp 98.9 F (37.2 C) (Oral)   Resp 18   Ht 5\' 2"  (1.575 m)   Wt 98.1 kg   LMP 01/01/2022   SpO2 100%   BMI 39.56 kg/m  EFM: 145/moderate/+accels, no decels  CVE: Dilation: 4.5 Effacement (%): 50 Cervical Position: Posterior Station: -2 Presentation: Vertex Exam by:: Ndule MD   [name]: 31 y.o. G2P1001 [redacted]w[redacted]d IOL for Intrahepatic cholestasis of pregnancy and gHTN. #Labor: Progressing well. Latent phase, s/p cytotec X1 round. AROM performed with clear fluid #Pain: IV fentayl, will try Nitrous oxide, consider Epidural when in pain. #FWB: Cat #GBS negative  gHTN - blood pressures stable. Continue to monitor  Dominic Mahaney [redacted]w[redacted]d, MD 10:42 AM

## 2022-09-18 ENCOUNTER — Ambulatory Visit: Payer: 59

## 2022-09-18 LAB — CBC
HCT: 27.5 % — ABNORMAL LOW (ref 36.0–46.0)
Hemoglobin: 9.2 g/dL — ABNORMAL LOW (ref 12.0–15.0)
MCH: 24.3 pg — ABNORMAL LOW (ref 26.0–34.0)
MCHC: 33.5 g/dL (ref 30.0–36.0)
MCV: 72.6 fL — ABNORMAL LOW (ref 80.0–100.0)
Platelets: 160 10*3/uL (ref 150–400)
RBC: 3.79 MIL/uL — ABNORMAL LOW (ref 3.87–5.11)
RDW: 21.2 % — ABNORMAL HIGH (ref 11.5–15.5)
WBC: 9 10*3/uL (ref 4.0–10.5)
nRBC: 0 % (ref 0.0–0.2)

## 2022-09-18 MED ORDER — FUROSEMIDE 20 MG PO TABS: 20.0000 mg | ORAL_TABLET | Freq: Every day | ORAL | 0 refills | Status: DC

## 2022-09-18 MED ORDER — SERTRALINE HCL 25 MG PO TABS: 25.0000 mg | ORAL_TABLET | Freq: Every day | ORAL | 2 refills | Status: DC

## 2022-09-18 MED ORDER — MEASLES, MUMPS & RUBELLA VAC IJ SOLR
0.5000 mL | Freq: Once | INTRAMUSCULAR | Status: AC
  Administered 2022-09-18: 0.5 mL via SUBCUTANEOUS

## 2022-09-18 MED ORDER — NIFEDIPINE ER OSMOTIC RELEASE 30 MG PO TB24
30.0000 mg | ORAL_TABLET | Freq: Every day | ORAL | Status: DC
  Administered 2022-09-18: 30 mg via ORAL
  Filled 2022-09-18: qty 1

## 2022-09-18 MED ORDER — PRENATAL MULTIVITAMIN CH: 1.0000 | ORAL_TABLET | Freq: Every day | ORAL | 2 refills | Status: DC

## 2022-09-18 MED ORDER — NIFEDIPINE ER 30 MG PO TB24: 30.0000 mg | ORAL_TABLET | Freq: Every day | ORAL | 0 refills | Status: DC

## 2022-09-18 NOTE — Discharge Instructions (Signed)
-   Continue your prenatal vitamins especially if breastfeeding - Try to eat iron rich food. - Take iron supplement as prescribed every other day. Take along with fruit or orange juice, avoid taking within 30 mins of eating dairy (milk, cheese) - Take over the counter tylenol (500mg) or ibuprofen (200mg) three times a day as needed for cramping/pain. - continue blood pressure medication. - Take water pill (lasix) as prescribed for a total of 5 days. - follow up in clinic in 1 week for a blood pressure check and in 4-6 weeks as scheduled for your regular post partum visit. - Please come back to [facility] if you notice persistently elevated blood pressures or you start to have a headache, that doesn't get better with medications (tylenol and ibuprofen), rest (4hrs of sleep) and drinking water.  

## 2022-09-18 NOTE — Social Work (Signed)
[name] was referred for history of anxiety. * Referral screened out by Clinical Social Worker because none of the following criteria appear to apply:  ~ History of anxiety/depression during this pregnancy, or of post-partum depression following prior delivery.  ~ Diagnosis of anxiety and/or depression within last 3 years OR * [name]'s symptoms currently being treated with medication and/or therapy.  Per chart review [name] has active prescription for hydroxyzine a needed.   Please contact the Clinical Social Worker if needs arise, or by [name] request.  Wende Neighbors, LCSWA Clinical Social Worker [phone]

## 2022-09-24 ENCOUNTER — Ambulatory Visit (INDEPENDENT_AMBULATORY_CARE_PROVIDER_SITE_OTHER): Payer: 59

## 2022-09-24 DIAGNOSIS — Z0130 Encounter for examination of blood pressure without abnormal findings: Secondary | ICD-10-CM

## 2022-09-24 NOTE — Progress Notes (Signed)
Subjective:  Robin Salas is a 31 y.o. female here for BP check.   Hypertension ROS: not taking medications regularly as instructed, medication side effects include: headaches, palpitations, and dizziness, no TIA's, no chest pain on exertion, no dyspnea on exertion, and no swelling of ankles.    Objective:  Blood Pressure: 133/85 Heart Rate: 90  Appearance alert, well appearing, and in no distress. General exam BP noted to be stable today in office.    Assessment:   Blood Pressure stable.   Plan:  Per Dr. Shawnie Pons, patient is able to stop NIFEdipine and continue monitoring blood pressure at home. If blood pressures remain stable no further intervention is needed at this time and we will see patient in office on 10/29/2022 for postpartum visit.   Patient to call the office and follow up as needed.

## 2022-09-26 ENCOUNTER — Encounter: Payer: 59 | Admitting: Obstetrics & Gynecology

## 2022-10-01 ENCOUNTER — Encounter: Payer: 59 | Admitting: Family Medicine

## 2022-10-08 ENCOUNTER — Encounter: Payer: 59 | Admitting: Family Medicine

## 2022-10-29 ENCOUNTER — Encounter: Payer: Self-pay | Admitting: Family Medicine

## 2022-10-29 ENCOUNTER — Ambulatory Visit (INDEPENDENT_AMBULATORY_CARE_PROVIDER_SITE_OTHER): Payer: 59 | Admitting: Family Medicine

## 2022-10-29 NOTE — Progress Notes (Signed)
[place] Partum Visit Note  Robin Salas is a 32 y.o. [id number] female who presents for a postpartum visit. She is 6 weeks postpartum following a normal spontaneous vaginal delivery.  I have fully reviewed the prenatal and intrapartum course. The delivery was at 53 gestational weeks.  Anesthesia: none. Postpartum course has been uncomplicated. Baby is doing well. Baby is feeding by breast. Bleeding no bleeding. Bowel function is normal. Bladder function is normal. Patient is not sexually active. Contraception method is condoms. Postpartum depression screening: negative.   The pregnancy intention screening data noted above was reviewed. Potential methods of contraception were discussed. The patient elected to proceed with No data recorded.   Edinburgh Postnatal Depression Scale - 10/29/22 1109       Edinburgh Postnatal Depression Scale:  In the Past 7 Days   I have been able to laugh and see the funny side of things. 0    I have looked forward with enjoyment to things. 0    I have blamed myself unnecessarily when things went wrong. 1    I have been anxious or worried for no good reason. 0    I have felt scared or panicky for no good reason. 0    Things have been getting on top of me. 0    I have been so unhappy that I have had difficulty sleeping. 0    I have felt sad or miserable. 0    I have been so unhappy that I have been crying. 0    The thought of harming myself has occurred to me. 0    Edinburgh Postnatal Depression Scale Total 1             Health Maintenance Due  Topic Date Due   PAP-Cervical Cytology Screening  Never done   PAP SMEAR-Modifier  Never done   COVID-19 Vaccine (3 - 2023-24 season) 05/30/2022    The following portions of the patient's history were reviewed and updated as appropriate: allergies, current medications, past family history, past medical history, past social history, past surgical history, and problem list.  Review of Systems Pertinent items  noted in HPI and remainder of comprehensive ROS otherwise negative.  Objective:  BP 133/80   Pulse 77   Wt 197 lb (89.4 kg)   Breastfeeding Yes   BMI 36.03 kg/m    General:  alert, cooperative, and appears stated age  Lungs: Normal effort  Heart:  regular rate and rhythm  Abdomen: soft, non-tender; bowel sounds normal; no masses,  no organomegaly        Assessment:    There are no diagnoses linked to this encounter.   postpartum exam.   Plan:   Essential components of care per ACOG recommendations:  1.  Mood and well being: Patient with negative depression screening today. Reviewed local resources for support.  Has Zoloft for use at home if needed. - Patient tobacco use? No.   - hx of drug use? No.    2. Infant care and feeding:  -Patient currently breastmilk feeding? Yes. Discussed returning to work and pumping.  -Social determinants of health ([place]) reviewed in [place]. No concerns   3. Sexuality, contraception and birth spacing - Patient does not want a pregnancy in the next year.  Desired family size is 2 children.  - Reviewed reproductive life planning. Reviewed contraceptive methods based on pt preferences and effectiveness.  Patient desired Female Condom today.   - Discussed birth spacing of 18 months  4. Sleep and fatigue -Encouraged family/partner/community support of 4 hrs of uninterrupted sleep to help with mood and fatigue  5. Physical Recovery  - Discussed patients delivery and complications. She describes her labor as good. - Patient had a Vaginal, no problems at delivery. Patient had a 2nd degree laceration. Perineal healing reviewed. Patient expressed understanding - Patient has urinary incontinence? No. - Patient is safe to resume physical and sexual activity  6.  Health Maintenance - HM due items addressed Yes - Last pap smear No results found for: "DIAGPAP" Pap smear not done at today's visit.  -Breast Cancer screening indicated? No.   7. Chronic  Disease/Pregnancy Condition follow up: None  - PCP follow up  Donnamae Jude, [place] for [place], [place]

## 2022-12-02 ENCOUNTER — Ambulatory Visit: Payer: 59 | Admitting: Obstetrics & Gynecology

## 2022-12-04 ENCOUNTER — Other Ambulatory Visit: Payer: Self-pay | Admitting: Obstetrics & Gynecology

## 2022-12-04 DIAGNOSIS — O219 Vomiting of pregnancy, unspecified: Secondary | ICD-10-CM

## 2022-12-29 ENCOUNTER — Ambulatory Visit: Payer: 59 | Admitting: Obstetrics & Gynecology

## 2022-12-29 ENCOUNTER — Encounter: Payer: Self-pay | Admitting: Obstetrics & Gynecology

## 2023-08-25 ENCOUNTER — Ambulatory Visit (INDEPENDENT_AMBULATORY_CARE_PROVIDER_SITE_OTHER): Payer: 59 | Admitting: Obstetrics & Gynecology

## 2023-08-25 ENCOUNTER — Encounter: Payer: Self-pay | Admitting: Obstetrics & Gynecology

## 2023-08-25 ENCOUNTER — Other Ambulatory Visit (HOSPITAL_COMMUNITY)
Admission: RE | Admit: 2023-08-25 | Discharge: 2023-08-25 | Disposition: A | Discharge status: Home / Self Care | Payer: 59 | Source: Ambulatory Visit | Attending: Obstetrics & Gynecology | Admitting: Obstetrics & Gynecology

## 2023-08-25 VITALS — BP 136/88 | HR 101 | Wt 222.4 lb

## 2023-08-25 DIAGNOSIS — Z1151 Encounter for screening for human papillomavirus (HPV): Secondary | ICD-10-CM

## 2023-08-25 DIAGNOSIS — N644 Mastodynia: Secondary | ICD-10-CM

## 2023-08-25 DIAGNOSIS — Z01419 Encounter for gynecological examination (general) (routine) without abnormal findings: Secondary | ICD-10-CM | POA: Insufficient documentation

## 2023-08-25 NOTE — Progress Notes (Signed)
GYNECOLOGY ANNUAL PREVENTATIVE CARE ENCOUNTER NOTE  History:     Robin Salas is a 32 y.o. G54P2002 female here for a routine annual gynecologic exam.  Current complaints: has right breast pain x 6 weeks. Stopped breastfeeding June 2024.  Able to express a little milk from breast but still feels very tender, especially on the inner part. No fevers, no redness, no purulent drainage.  Denies abnormal vaginal bleeding, discharge, pelvic pain, problems with intercourse or other gynecologic concerns.    Gynecologic History No LMP recorded. Contraception: condoms Last Pap: over 2 years ago. Result was normal with negative HPV   Obstetric History OB History  Gravida Para Term Preterm AB Living  2 2 2     2   SAB IAB Ectopic Multiple Live Births        0 2    # Outcome Date GA Lbr Len/2nd Weight Sex Type Anes PTL Lv  2 Term 09/17/22 [redacted]w[redacted]d  6 lb 11.6 oz (3.05 kg) F Vag-Spont Local  LIV  1 Term 04/06/20 [redacted]w[redacted]d 04:07 / 05:20 7 lb 9.7 oz (3.45 kg) M Vag-Vacuum EPI  LIV    Past Medical History:  Diagnosis Date   Acid reflux 11/27/2020   Allergic rhinitis 03/01/2015   Anemia    Fracture, pathologic, vertebra    last 4 vertabra   Gestational diabetes    Gestational hypertension    Hyperhidrosis, focal, primary 01/22/2011   PCOS (polycystic ovarian syndrome), likely 11/22/2012   Postpartum anxiety 04/08/2020   Wedge deformity on x-ray of spine    last 4 vertebra of spine    Past Surgical History:  Procedure Laterality Date   CHOLECYSTECTOMY     WISDOM TOOTH EXTRACTION      No current outpatient medications on file prior to visit.   No current facility-administered medications on file prior to visit.    Allergies  Allergen Reactions   Amoxil [Amoxicillin] Hives   Penicillins Hives   Wound Dressing Adhesive Itching and Rash    Unknown adhesive    Social History:  reports that she has never smoked. She has never used smokeless tobacco. She reports that she does not  drink alcohol and does not use drugs.  Family History  Problem Relation Age of Onset   Hypertension Paternal Grandfather    Heart disease Paternal Grandfather    Hypertension Paternal Grandmother    Heart disease Paternal Grandmother    Hypertension Maternal Grandmother    Heart disease Maternal Grandmother    Diabetes Maternal Grandmother    Cancer Maternal Grandmother    Asthma Maternal Grandmother    Hypertension Maternal Grandfather    Heart disease Maternal Grandfather    Prostate cancer Maternal Grandfather    Bone cancer Maternal Grandfather    Hypertension Father    Hypertension Mother    Breast cancer Paternal Aunt    Birth defects Neg Hx    Stroke Neg Hx     The following portions of the patient's history were reviewed and updated as appropriate: allergies, current medications, past family history, past medical history, past social history, past surgical history and problem list.  Review of Systems Pertinent items noted in HPI and remainder of comprehensive ROS otherwise negative.  Physical Exam:  BP 136/88   Pulse (!) 101   Wt 222 lb 6.4 oz (100.9 kg)   BMI 40.68 kg/m  CONSTITUTIONAL: Well-developed, well-nourished female in no acute distress.  HENT:  Normocephalic, atraumatic, External right and left ear normal.  EYES: Conjunctivae and EOM are normal. Pupils are equal, round, and reactive to light. No scleral icterus.  NECK: Normal range of motion, supple, no masses.  Normal thyroid.  SKIN: Skin is warm and dry. No rash noted. Not diaphoretic. No erythema. No pallor. MUSCULOSKELETAL: Normal range of motion. No tenderness.  No cyanosis, clubbing, or edema. NEUROLOGIC: Alert and oriented to person, place, and time. Normal reflexes, muscle tone coordination.  PSYCHIATRIC: Normal mood and affect. Normal behavior. Normal judgment and thought content. CARDIOVASCULAR: Normal heart rate noted, regular rhythm RESPIRATORY: Clear to auscultation bilaterally. Effort and  breath sounds normal, no problems with respiration noted. BREASTS: Right breast slightly bigger than left (this is normal state for her).  Prominent  possible ductal tissue palpated along the medial aspect of breast, tender to touch. No erythema, skin changes, drainage noted.  No other masses, tenderness, skin changes, nipple drainage of left breast, no lymphadenopathy bilaterally. Performed in the presence of a chaperone. ABDOMEN: Soft, no distention noted.  No tenderness, rebound or guarding.  PELVIC: Normal appearing external genitalia and urethral meatus; normal appearing vaginal mucosa and cervix.  No abnormal vaginal discharge noted.  Pap smear obtained.  Normal uterine size, no other palpable masses, no uterine or adnexal tenderness.  Performed in the presence of a chaperone.   Assessment and Plan:     1. Pain of right breast Could be blocked ducts or other etiology. Unsure at this point. Will obtain breast imaging for further evaluation. - [place] LIMITED ULTRASOUND INCLUDING AXILLA RIGHT BREAST; Future - MM 3D DIAGNOSTIC MAMMOGRAM BILATERAL BREAST; Future  2. Well woman exam with routine gynecological exam - Cytology - PAP Will follow up results of pap smear and manage accordingly. Routine preventative health maintenance measures emphasized. Please refer to After Visit Summary for other counseling recommendations.      Jaynie Collins, MD, FACOG Obstetrician & Gynecologist, [facility] for [employer], [facility]

## 2023-09-02 LAB — CYTOLOGY - PAP
Comment: NEGATIVE
Diagnosis: UNDETERMINED — AB
High risk HPV: NEGATIVE

## 2023-09-28 ENCOUNTER — Ambulatory Visit: Payer: 59

## 2023-09-28 ENCOUNTER — Ambulatory Visit
Admission: RE | Admit: 2023-09-28 | Discharge: 2023-09-28 | Disposition: A | Discharge status: Home / Self Care | Payer: 59 | Source: Ambulatory Visit | Attending: Obstetrics & Gynecology | Admitting: Obstetrics & Gynecology

## 2023-09-28 DIAGNOSIS — N644 Mastodynia: Secondary | ICD-10-CM

## 2023-10-02 ENCOUNTER — Other Ambulatory Visit: Payer: 59

## 2023-11-17 ENCOUNTER — Encounter: Payer: Self-pay | Admitting: Family Medicine

## 2023-11-17 ENCOUNTER — Ambulatory Visit (INDEPENDENT_AMBULATORY_CARE_PROVIDER_SITE_OTHER): Payer: 59 | Admitting: Family Medicine

## 2023-11-17 VITALS — BP 130/78 | HR 105 | Temp 98.5°F | Ht 61.75 in | Wt 216.0 lb

## 2023-11-17 DIAGNOSIS — S30861A Insect bite (nonvenomous) of abdominal wall, initial encounter: Secondary | ICD-10-CM

## 2023-11-17 DIAGNOSIS — W57XXXS Bitten or stung by nonvenomous insect and other nonvenomous arthropods, sequela: Secondary | ICD-10-CM

## 2023-11-17 DIAGNOSIS — L02211 Cutaneous abscess of abdominal wall: Secondary | ICD-10-CM | POA: Diagnosis not present

## 2023-11-17 DIAGNOSIS — S30861S Insect bite (nonvenomous) of abdominal wall, sequela: Secondary | ICD-10-CM

## 2023-11-17 MED ORDER — SULFAMETHOXAZOLE-TRIMETHOPRIM 800-160 MG PO TABS: 2.0000 | ORAL_TABLET | Freq: Two times a day (BID) | ORAL | 0 refills | Status: AC

## 2023-11-17 NOTE — Progress Notes (Unsigned)
    Patient ID: Robin Salas, female    DOB: Jul 06, 1991, 33 y.o.   MRN: [id number]  This visit was conducted in person.  BP 130/78 (BP Location: Left Arm, Patient Position: Sitting, Cuff Size: Large)   Pulse (!) 105   Temp 98.5 F (36.9 C) (Temporal)   Ht 5' 1.75" (1.568 m)   Wt 216 lb (98 kg)   LMP 11/16/2023   SpO2 99%   Breastfeeding No   BMI 39.83 kg/m    CC:  Chief Complaint  Patient presents with   Abscess    Lower Left Abdomen    Subjective:   HPI: Robin Salas is a 33 y.o. female presenting on 11/17/2023 for Abscess (Lower Left Abdomen)   2 weeks... noted and area on her abdomen, possible ingrown hair. Not sure if it was a bite.  Started as pustule, gradually increasing in size, redness around site.   Very uncomfortable in last 5 days.Marland Kitchen  started warm compresses... area opened, drained bloody pus.  Continuing to drain.   Intermittent flu like symptoms in last few weeks.... She does remember going hiking 10/31/23... no known bite but felt very fatigued., chest sore, body pain all over.   1 week ago had a few days where ran low grade temp, no cold symptoms.  No abdominal pain otherwise.   Has had abscess in past.  Possible MRSA years ago [place].. she thinks this was tested.        Relevant past medical, surgical, family and social history reviewed and updated as indicated. Interim medical history since our last visit reviewed. Allergies and medications reviewed and updated. No outpatient medications prior to visit.   No facility-administered medications prior to visit.     Per HPI unless specifically indicated in ROS section below Review of Systems Objective:  BP 130/78 (BP Location: Left Arm, Patient Position: Sitting, Cuff Size: Large)   Pulse (!) 105   Temp 98.5 F (36.9 C) (Temporal)   Ht 5' 1.75" (1.568 m)   Wt 216 lb (98 kg)   LMP 11/16/2023   SpO2 99%   Breastfeeding No   BMI 39.83 kg/m   Wt Readings from Last 3 Encounters:   11/17/23 216 lb (98 kg)  08/25/23 222 lb 6.4 oz (100.9 kg)  10/29/22 197 lb (89.4 kg)      Physical Exam    Results for orders placed or performed in visit on 08/25/23  Cytology - PAP   Collection Time: 08/25/23  2:49 PM  Result Value Ref Range   High risk HPV Negative    Adequacy      Satisfactory for evaluation; transformation zone component PRESENT.   Diagnosis (A)     - Atypical squamous cells of undetermined significance (ASC-US)   Comment Normal Reference Range HPV - Negative     Assessment and Plan  There are no diagnoses linked to this encounter.  No follow-ups on file.   Kerby Nora, MD

## 2023-11-18 ENCOUNTER — Encounter: Payer: Self-pay | Admitting: Family Medicine

## 2023-11-19 ENCOUNTER — Ambulatory Visit (INDEPENDENT_AMBULATORY_CARE_PROVIDER_SITE_OTHER): Payer: 59 | Admitting: Family Medicine

## 2023-11-19 ENCOUNTER — Ambulatory Visit: Payer: 59 | Admitting: Family Medicine

## 2023-11-19 VITALS — BP 110/62 | HR 85 | Temp 98.0°F | Ht 61.75 in | Wt 217.1 lb

## 2023-11-19 DIAGNOSIS — T7849XA Other allergy, initial encounter: Secondary | ICD-10-CM

## 2023-11-19 DIAGNOSIS — L02211 Cutaneous abscess of abdominal wall: Secondary | ICD-10-CM | POA: Diagnosis not present

## 2023-11-19 DIAGNOSIS — S30861A Insect bite (nonvenomous) of abdominal wall, initial encounter: Secondary | ICD-10-CM | POA: Insufficient documentation

## 2023-11-19 NOTE — Progress Notes (Signed)
 Patient ID: Robin Salas, female    DOB: August 03, 1991, 33 y.o.   MRN: [id number]  This visit was conducted in person.  BP 110/62 (BP Location: Left Arm, Patient Position: Sitting, Cuff Size: Large)   Pulse 85   Temp 98 F (36.7 C) (Temporal)   Ht 5' 1.75" (1.568 m)   Wt 217 lb 2 oz (98.5 kg)   LMP 11/16/2023   SpO2 99%   BMI 40.03 kg/m    CC: No chief complaint on file.   Subjective:   HPI: Robin Salas is a 33 y.o. female presenting on 11/19/2023 for No chief complaint on file.  She reports for 2 day recheck on wound on  draining abscess on lower abdomen with associated cellulitits. She is on day 2 of bactrim DS 2 tabs po BID x 10 days... she is tolerating this well.  Has not been doing warm compresses.  She has noted less pain, less redness, less drainage.  No fever.  No longer having flu like symptoms.      Relevant past medical, surgical, family and social history reviewed and updated as indicated. Interim medical history since our last visit reviewed. Allergies and medications reviewed and updated. Outpatient Medications Prior to Visit  Medication Sig Dispense Refill   sulfamethoxazole-trimethoprim (BACTRIM DS) 800-160 MG tablet Take 2 tablets by mouth 2 (two) times daily. 40 tablet 0   No facility-administered medications prior to visit.     Per HPI unless specifically indicated in ROS section below Review of Systems  Constitutional:  Negative for fatigue and fever.  HENT:  Negative for congestion.   Eyes:  Negative for pain.  Respiratory:  Negative for cough and shortness of breath.   Cardiovascular:  Negative for chest pain, palpitations and leg swelling.  Gastrointestinal:  Negative for abdominal pain.  Genitourinary:  Negative for dysuria and vaginal bleeding.  Musculoskeletal:  Negative for back pain.  Neurological:  Negative for syncope, light-headedness and headaches.  Psychiatric/Behavioral:  Negative for dysphoric mood.    Objective:  BP  110/62 (BP Location: Left Arm, Patient Position: Sitting, Cuff Size: Large)   Pulse 85   Temp 98 F (36.7 C) (Temporal)   Ht 5' 1.75" (1.568 m)   Wt 217 lb 2 oz (98.5 kg)   LMP 11/16/2023   SpO2 99%   BMI 40.03 kg/m   Wt Readings from Last 3 Encounters:  11/19/23 217 lb 2 oz (98.5 kg)  11/17/23 216 lb (98 kg)  08/25/23 222 lb 6.4 oz (100.9 kg)      Physical Exam Constitutional:      General: She is not in acute distress.    Appearance: Normal appearance. She is well-developed. She is not ill-appearing or toxic-appearing.  HENT:     Head: Normocephalic.     Right Ear: Hearing, tympanic membrane, ear canal and external ear normal. Tympanic membrane is not erythematous, retracted or bulging.     Left Ear: Hearing, tympanic membrane, ear canal and external ear normal. Tympanic membrane is not erythematous, retracted or bulging.     Nose: No mucosal edema or rhinorrhea.     Right Sinus: No maxillary sinus tenderness or frontal sinus tenderness.     Left Sinus: No maxillary sinus tenderness or frontal sinus tenderness.     Mouth/Throat:     Pharynx: Uvula midline.  Eyes:     General: Lids are normal. Lids are everted, no foreign bodies appreciated.     Conjunctiva/sclera: Conjunctivae normal.  Pupils: Pupils are equal, round, and reactive to light.  Neck:     Thyroid: No thyroid mass or thyromegaly.     Vascular: No carotid bruit.     Trachea: Trachea normal.  Cardiovascular:     Rate and Rhythm: Normal rate and regular rhythm.     Pulses: Normal pulses.     Heart sounds: Normal heart sounds, S1 normal and S2 normal. No murmur heard.    No friction rub. No gallop.  Pulmonary:     Effort: Pulmonary effort is normal. No tachypnea or respiratory distress.     Breath sounds: Normal breath sounds. No decreased breath sounds, wheezing, rhonchi or rales.  Abdominal:     General: Bowel sounds are normal.     Palpations: Abdomen is soft.     Tenderness: There is no abdominal  tenderness.  Musculoskeletal:     Cervical back: Normal range of motion and neck supple.  Skin:    General: Skin is warm and dry.     Findings: No rash.  Neurological:     Mental Status: She is alert.  Psychiatric:        Mood and Affect: Mood is not anxious or depressed.        Speech: Speech normal.        Behavior: Behavior normal. Behavior is cooperative.        Thought Content: Thought content normal.        Judgment: Judgment normal.       Results for orders placed or performed in visit on 11/17/23  WOUND CULTURE   Collection Time: 11/17/23  2:41 PM   Specimen: Blood  Result Value Ref Range   MICRO NUMBER: [id number]    SPECIMEN QUALITY: Adequate    SOURCE: WOUND    STATUS: PRELIMINARY    GRAM STAIN:      No white blood cells seen Few epithelial cells No organisms seen   RESULT: No growth to date   CBC with Differential/Platelet   Collection Time: 11/17/23  2:41 PM  Result Value Ref Range   WBC 7.4 3.8 - 10.8 Thousand/uL   RBC 5.04 3.80 - 5.10 Million/uL   Hemoglobin 11.9 11.7 - 15.5 g/dL   HCT 54.0 98.1 - 19.1 %   MCV 73.6 (L) 80.0 - 100.0 fL   MCH 23.6 (L) 27.0 - 33.0 pg   MCHC 32.1 32.0 - 36.0 g/dL   RDW 47.8 (H) 29.5 - 62.1 %   Platelets 246 140 - 400 Thousand/uL   MPV 12.6 (H) 7.5 - 12.5 fL   Neutro Abs 4,558 1,500 - 7,800 cells/uL   Absolute Lymphocytes 2,205 850 - 3,900 cells/uL   Absolute Monocytes 422 200 - 950 cells/uL   Eosinophils Absolute 178 15 - 500 cells/uL   Basophils Absolute 37 0 - 200 cells/uL   Neutrophils Relative % 61.6 %   Total Lymphocyte 29.8 %   Monocytes Relative 5.7 %   Eosinophils Relative 2.4 %   Basophils Relative 0.5 %    Assessment and Plan  Abscess of skin of abdomen Assessment & Plan: Acute, she feels significantly better overall with initiation of antibiotics.  Indurated tissue and erythema has decreased significantly.  There is significant improvement in wound.  Encouraged her to continue warm compresses and  antibacterial soap.  Apply topical antibiotic ointment.  Complete full course of Bactrim x 10 days.   Return and ER precautions provided   Allergic reaction to adhesive Assessment & Plan: Acute, she does have  area of irritation where bandage adhesive is.  Recommended using either panty liner or gauze to cover wound with either paper tape or no adhesive.  She can use topical cortisone 10 over-the-counter twice daily on surrounding skin if itchy and irritated.     No follow-ups on file.   Kerby Nora, MD

## 2023-11-19 NOTE — Assessment & Plan Note (Signed)
 Acute, she feels significantly better overall with initiation of antibiotics.  Indurated tissue and erythema has decreased significantly.  There is significant improvement in wound.  Encouraged her to continue warm compresses and antibacterial soap.  Apply topical antibiotic ointment.  Complete full course of Bactrim x 10 days.   Return and ER precautions provided

## 2023-11-19 NOTE — Assessment & Plan Note (Signed)
 Acute, possible initial insect bite now infected versus irritated pore/ingrown hair.  Wound currently draining and no indication for incision and drainage. Recommend treating with coverage for MRSA given possible MRSA infection years ago. Will treat with Bactrim double strength 2 tablets twice daily x 10 days. Recommend continued wound warm compresses.  Apply topical antibiotic. Recommended cleaning personal items with bleach or hot water, use Dial antibacterial soap.  Return and ER precautions provided. Close follow-up in 2 days for wound check.

## 2023-11-19 NOTE — Assessment & Plan Note (Signed)
 Acute, she does have area of irritation where bandage adhesive is.  Recommended using either panty liner or gauze to cover wound with either paper tape or no adhesive.  She can use topical cortisone 10 over-the-counter twice daily on surrounding skin if itchy and irritated.

## 2023-11-19 NOTE — Assessment & Plan Note (Signed)
 Acute, I feel symptoms are most likely secondary to ongoing cellulitis but she has concerns that she might have been bitten by a tick.  Will evaluate for tickborne illness labs.  Check CBC.

## 2023-11-21 LAB — WOUND CULTURE
MICRO NUMBER:: 16098484
RESULT:: NO GROWTH
SPECIMEN QUALITY:: ADEQUATE

## 2023-11-21 LAB — CBC WITH DIFFERENTIAL/PLATELET
Absolute Lymphocytes: 2205 {cells}/uL (ref 850–3900)
Absolute Monocytes: 422 {cells}/uL (ref 200–950)
Basophils Absolute: 37 {cells}/uL (ref 0–200)
Basophils Relative: 0.5 %
Eosinophils Absolute: 178 {cells}/uL (ref 15–500)
Eosinophils Relative: 2.4 %
HCT: 37.1 % (ref 35.0–45.0)
Hemoglobin: 11.9 g/dL (ref 11.7–15.5)
MCH: 23.6 pg — ABNORMAL LOW (ref 27.0–33.0)
MCHC: 32.1 g/dL (ref 32.0–36.0)
MCV: 73.6 fL — ABNORMAL LOW (ref 80.0–100.0)
MPV: 12.6 fL — ABNORMAL HIGH (ref 7.5–12.5)
Monocytes Relative: 5.7 %
Neutro Abs: 4558 {cells}/uL (ref 1500–7800)
Neutrophils Relative %: 61.6 %
Platelets: 246 10*3/uL (ref 140–400)
RBC: 5.04 10*6/uL (ref 3.80–5.10)
RDW: 15.4 % — ABNORMAL HIGH (ref 11.0–15.0)
Total Lymphocyte: 29.8 %
WBC: 7.4 10*3/uL (ref 3.8–10.8)

## 2023-11-21 LAB — B. BURGDORFI ANTIBODIES BY WB
B burgdorferi IgG Abs (IB): NEGATIVE
B burgdorferi IgM Abs (IB): NEGATIVE
Lyme Disease 18 kD IgG: NONREACTIVE
Lyme Disease 23 kD IgG: NONREACTIVE
Lyme Disease 23 kD IgM: NONREACTIVE
Lyme Disease 28 kD IgG: NONREACTIVE
Lyme Disease 30 kD IgG: NONREACTIVE
Lyme Disease 39 kD IgG: NONREACTIVE
Lyme Disease 39 kD IgM: NONREACTIVE
Lyme Disease 41 kD IgG: NONREACTIVE
Lyme Disease 41 kD IgM: REACTIVE — AB
Lyme Disease 45 kD IgG: NONREACTIVE
Lyme Disease 58 kD IgG: NONREACTIVE
Lyme Disease 66 kD IgG: NONREACTIVE
Lyme Disease 93 kD IgG: NONREACTIVE

## 2023-11-21 LAB — EHRLICHIA ANTIBODY PANEL
E. CHAFFEENSIS AB IGG: <1:64 {titer}
E. CHAFFEENSIS AB IGM: <1:20 {titer}

## 2023-11-21 LAB — ROCKY MTN SPOTTED FVR ABS PNL(IGG+IGM)
RMSF IgG: NOT DETECTED
RMSF IgM: NOT DETECTED

## 2023-12-02 ENCOUNTER — Ambulatory Visit (INDEPENDENT_AMBULATORY_CARE_PROVIDER_SITE_OTHER): Admitting: Physician Assistant

## 2023-12-02 ENCOUNTER — Encounter: Payer: Self-pay | Admitting: Physician Assistant

## 2023-12-02 ENCOUNTER — Other Ambulatory Visit (INDEPENDENT_AMBULATORY_CARE_PROVIDER_SITE_OTHER): Payer: Self-pay

## 2023-12-02 DIAGNOSIS — M545 Low back pain, unspecified: Secondary | ICD-10-CM

## 2023-12-02 MED ORDER — METHYLPREDNISOLONE 4 MG PO TBPK: ORAL_TABLET | ORAL | 0 refills | Status: AC

## 2023-12-02 NOTE — Progress Notes (Signed)
 Office Visit Note   Patient: Robin Salas           Date of Birth: Sep 04, 1991           MRN: [id number] Visit Date: 12/02/2023              Requested by: Excell Seltzer, MD [address] [place],  [place] [address] PCP: Excell Seltzer, MD   Assessment & Plan: Visit Diagnoses:  1. Acute midline low back pain without sciatica     Plan: Patient is a pleasant 33 year old woman who comes in today with back pain.  She has had difficulties with her back for 13 years since she fell while skateboarding on sand.  She has had on and off back spasms and she has done physical therapy and done very well.  She comes in today because she has had acute onset of different pain in her lower back.  Her biggest concern is that it is causing weakness in her legs and paresthesias in her feet.  She does on exam have decreased strength in both of her lower extremities especially with resisted extension and flexion of her legs.  This came on suddenly has me concerned for progression of her spinal symptoms.  She does have a family history of some type of spinal compression.  Would like to get an MRI of her lumbar spine.  Have also given her prescription for Medrol Dosepak she knows if any of the symptoms get worse she is to go to the emergency room.  This all began when she was lifting her daughter  Follow-Up Instructions: After MRI  Orders:  Orders Placed This Encounter  Procedures   XR Lumbar Spine 2-3 Views   No orders of the defined types were placed in this encounter.     Procedures: No procedures performed   Clinical Data: No additional findings.   Subjective: Chief Complaint  Patient presents with   Lower Back - Pain    HPI patient is a pleasant 33 year old woman with a chief complaint of low back pain.  Injured her back around 13 years ago after a fall.  She does happen while snowboarding on sand has done PT she did help about 7 years ago she has bilateral foot numbness.   Yesterday pain and twinges returned different pain than in the past no loss of bowel or bladder control  Review of Systems  All other systems reviewed and are negative.    Objective: Vital Signs: LMP 11/16/2023   Physical Exam Constitutional:      Appearance: Normal appearance.  Pulmonary:     Effort: Pulmonary effort is normal.  Skin:    General: Skin is warm and dry.  Neurological:     General: No focal deficit present.     Mental Status: She is alert.  Psychiatric:        Mood and Affect: Mood normal.        Behavior: Behavior normal.     Ortho Exam Examination of her lower back she has no step-off she has no pain to percussion over the kidney area.  She has no pain with forward bending but does have exacerbation of her pain with extension.  She has altered sensation on the bottom of her feet.  She has fair strength with dorsiflexion and plantarflexion has bilateral decreased strength with resisted flexion and extension.  She is able to ambulate but has an antalgic gait Specialty Comments:  No specialty comments available.  Imaging: No results found.   PMFS History: Patient Active Problem List   Diagnosis Date Noted   Low back pain 12/02/2023   Abscess of skin of abdomen 11/19/2023   Insect bite of abdominal wall 11/19/2023   Allergic reaction to adhesive 11/19/2023   Past Medical History:  Diagnosis Date   Acid reflux 11/27/2020   Allergic rhinitis 03/01/2015   Anemia    Fracture, pathologic, vertebra    last 4 vertabra   Gestational diabetes    Gestational hypertension    Hyperhidrosis, focal, primary 01/22/2011   PCOS (polycystic ovarian syndrome), likely 11/22/2012   Postpartum anxiety 04/08/2020   Wedge deformity on x-ray of spine    last 4 vertebra of spine    Family History  Problem Relation Age of Onset   Hypertension Paternal Grandfather    Heart disease Paternal Grandfather    Hypertension Paternal Grandmother    Heart disease Paternal  Grandmother    Hypertension Maternal Grandmother    Heart disease Maternal Grandmother    Diabetes Maternal Grandmother    Cancer Maternal Grandmother    Asthma Maternal Grandmother    Hypertension Maternal Grandfather    Heart disease Maternal Grandfather    Prostate cancer Maternal Grandfather    Bone cancer Maternal Grandfather    Hypertension Father    Hypertension Mother    Breast cancer Paternal Aunt    Birth defects Neg Hx    Stroke Neg Hx     Past Surgical History:  Procedure Laterality Date   CHOLECYSTECTOMY     WISDOM TOOTH EXTRACTION     Social History   Occupational History   Not on file  Tobacco Use   Smoking status: Never   Smokeless tobacco: Never  Vaping Use   Vaping status: Never Used  Substance and Sexual Activity   Alcohol use: No   Drug use: No   Sexual activity: Yes    Birth control/protection: None

## 2023-12-06 ENCOUNTER — Ambulatory Visit
Admission: RE | Admit: 2023-12-06 | Discharge: 2023-12-06 | Disposition: A | Discharge status: Home / Self Care | Source: Ambulatory Visit | Attending: Physician Assistant | Admitting: Physician Assistant

## 2023-12-06 DIAGNOSIS — M545 Low back pain, unspecified: Secondary | ICD-10-CM

## 2023-12-24 ENCOUNTER — Other Ambulatory Visit: Payer: Self-pay | Admitting: Physician Assistant

## 2023-12-24 DIAGNOSIS — M545 Low back pain, unspecified: Secondary | ICD-10-CM

## 2024-01-07 ENCOUNTER — Other Ambulatory Visit (INDEPENDENT_AMBULATORY_CARE_PROVIDER_SITE_OTHER): Payer: Self-pay

## 2024-01-07 ENCOUNTER — Ambulatory Visit: Admitting: Orthopedic Surgery

## 2024-01-07 VITALS — BP 119/82 | Ht 61.75 in | Wt 217.0 lb

## 2024-01-07 DIAGNOSIS — M545 Low back pain, unspecified: Secondary | ICD-10-CM

## 2024-01-07 NOTE — Progress Notes (Signed)
 Orthopedic Spine Surgery Office Note  Assessment: Patient is a 33 y.o. female with improved back pain.  Has a disc herniation at L5/S1 that I suspect was the cause of her acute onset of worsening back pain   Plan: -Pain has gotten significantly better and is back to her baseline chronic back pain, so recommended no further intervention at this time -Talked about weight loss.  She is already working on that.  Explained this to be helpful to her overall health and likely help with her back pain.  We talked about core strengthening as a good exercise to help her back as well -Patient should return to office on an as-needed basis   Patient expressed understanding of the plan and all questions were answered to the patient's satisfaction.   ___________________________________________________________________________   History:  Patient is a 33 y.o. female who presents today for lumbar spine.  Patient recently had an acute worsening of her chronic low back pain.  She said she has had back pain since she was 20 when she had a fall that injured her back.  She usually has spasms in her back that eventually resolved.  She has pain on a daily basis but it does not limit her in any way and she does not really notice it when she focuses on it.  Within the last 2 months, she has had worsening back pain.  There is no trauma or injury that preceded the onset of this worsening pain.  She said that the pain has gotten significantly better in the last couple weeks.  She is not taking any medications for pain.  She is not limited in any of her activities at this point.  She does not have any pain radiating to her lower extremities.  No bowel or bladder incontinence.  No saddle anesthesia.   Review of systems: Denies fevers and chills, night sweats, unexplained weight loss, history of cancer. Has had pain that wakes her at night  Past medical history: GERD Hyperhidrosis  Allergies: amoxicillin, penicillin,  adhesive dressings  Past surgical history:  Cholecystectomy  Social history: Denies use of nicotine product (smoking, vaping, patches, smokeless) Alcohol use: Denies Denies recreational drug use   Physical Exam:  BMI of 40.0  General: no acute distress, appears stated age Neurologic: alert, answering questions appropriately, following commands Respiratory: unlabored breathing on room air, symmetric chest rise Psychiatric: appropriate affect, normal cadence to speech   MSK (spine):  -Strength exam      Left  Right EHL    5/5  5/5 TA    5/5  5/5 GSC    5/5  5/5 Knee extension  5/5  5/5 Hip flexion   5/5  5/5  -Sensory exam    Sensation intact to light touch in L3-S1 nerve distributions of bilateral lower extremities  -Achilles DTR: 2/4 on the left, 2/4 on the right -Patellar tendon DTR: 2/4 on the left, 2/4 on the right  -Straight leg raise: negative bilaterally -Clonus: no beats bilaterally  Imaging: XRs of the lumbar spine from 01/07/2024 were independently reviewed and interpreted, showing no significant degenerative changes. No fracture or dislocation seen. No evidence of instability on flexion/extension views.   MRI of the lumbar spine from 12/06/2023 was independently reviewed and interpreted, showing small right paracentral disc herniation behind the body of T12.  No stenosis seen as result of that disc herniation.  Small central disc herniation at L5/S1 that appears noncompressive.  No significant central, lateral recess, foraminal stenosis seen.  Patient name: Robin Salas Patient MRN: [id number] Date of visit: 01/07/24

## 2024-02-03 ENCOUNTER — Other Ambulatory Visit: Payer: Self-pay | Admitting: Family Medicine

## 2024-08-01 ENCOUNTER — Encounter: Payer: Self-pay | Admitting: Radiology
